# Patient Record
Sex: Male | Born: 1990 | Race: Black or African American | Hispanic: No | Marital: Single | Smoking: Current some day smoker
Health system: Southern US, Community
[De-identification: ages and names within clinical notes are randomized; demographics above are authoritative.]

## PROBLEM LIST (undated history)

## (undated) DIAGNOSIS — R0602 Shortness of breath: Secondary | ICD-10-CM

## (undated) DIAGNOSIS — G971 Other reaction to spinal and lumbar puncture: Secondary | ICD-10-CM

---

## 2009-11-26 ENCOUNTER — Emergency Department (HOSPITAL_COMMUNITY): Admission: EM | Admit: 2009-11-26 | Discharge: 2009-11-26 | Payer: Self-pay | Admitting: Emergency Medicine

## 2010-03-14 LAB — RAPID URINE DRUG SCREEN, HOSP PERFORMED
Amphetamines: NOT DETECTED
Barbiturates: NOT DETECTED
Cocaine: NOT DETECTED
Opiates: NOT DETECTED
Tetrahydrocannabinol: POSITIVE — AB

## 2010-12-21 ENCOUNTER — Inpatient Hospital Stay (HOSPITAL_COMMUNITY)
Admission: EM | Admit: 2010-12-21 | Discharge: 2010-12-28 | DRG: 158 | Disposition: A | Discharge status: Home / Self Care | Payer: Self-pay | Attending: Internal Medicine | Admitting: Internal Medicine

## 2010-12-21 ENCOUNTER — Emergency Department (HOSPITAL_COMMUNITY): Payer: Self-pay

## 2010-12-21 DIAGNOSIS — S025XXA Fracture of tooth (traumatic), initial encounter for closed fracture: Secondary | ICD-10-CM | POA: Diagnosis present

## 2010-12-21 DIAGNOSIS — K029 Dental caries, unspecified: Secondary | ICD-10-CM | POA: Diagnosis present

## 2010-12-21 DIAGNOSIS — F17200 Nicotine dependence, unspecified, uncomplicated: Secondary | ICD-10-CM | POA: Diagnosis present

## 2010-12-21 DIAGNOSIS — R599 Enlarged lymph nodes, unspecified: Secondary | ICD-10-CM | POA: Diagnosis present

## 2010-12-21 DIAGNOSIS — L0201 Cutaneous abscess of face: Secondary | ICD-10-CM | POA: Diagnosis present

## 2010-12-21 DIAGNOSIS — S02609A Fracture of mandible, unspecified, initial encounter for closed fracture: Principal | ICD-10-CM | POA: Diagnosis present

## 2010-12-21 DIAGNOSIS — M7989 Other specified soft tissue disorders: Secondary | ICD-10-CM | POA: Diagnosis present

## 2010-12-21 DIAGNOSIS — S02650A Fracture of angle of mandible, unspecified side, initial encounter for closed fracture: Secondary | ICD-10-CM | POA: Diagnosis present

## 2010-12-21 DIAGNOSIS — K011 Impacted teeth: Secondary | ICD-10-CM | POA: Diagnosis present

## 2010-12-21 DIAGNOSIS — W1809XA Striking against other object with subsequent fall, initial encounter: Secondary | ICD-10-CM | POA: Diagnosis present

## 2010-12-21 DIAGNOSIS — Z8669 Personal history of other diseases of the nervous system and sense organs: Secondary | ICD-10-CM

## 2010-12-21 DIAGNOSIS — Z23 Encounter for immunization: Secondary | ICD-10-CM

## 2010-12-21 DIAGNOSIS — L03211 Cellulitis of face: Secondary | ICD-10-CM | POA: Diagnosis present

## 2010-12-21 HISTORY — DX: Other reaction to spinal and lumbar puncture: G97.1

## 2010-12-21 HISTORY — DX: Shortness of breath: R06.02

## 2010-12-21 LAB — DIFFERENTIAL
Basophils Absolute: 0 10*3/uL (ref 0.0–0.1)
Basophils Relative: 0 % (ref 0–1)
Eosinophils Absolute: 0.1 10*3/uL (ref 0.0–0.7)
Monocytes Absolute: 0.9 10*3/uL (ref 0.1–1.0)
Monocytes Relative: 8 % (ref 3–12)
Neutro Abs: 8.1 10*3/uL — ABNORMAL HIGH (ref 1.7–7.7)

## 2010-12-21 LAB — HEPATIC FUNCTION PANEL
Albumin: 3.2 g/dL — ABNORMAL LOW (ref 3.5–5.2)
Total Bilirubin: 0.7 mg/dL (ref 0.3–1.2)
Total Protein: 7.2 g/dL (ref 6.0–8.3)

## 2010-12-21 LAB — POCT I-STAT, CHEM 8
Calcium, Ion: 1.15 mmol/L (ref 1.12–1.32)
Chloride: 105 mEq/L (ref 96–112)
Glucose, Bld: 75 mg/dL (ref 70–99)
HCT: 43 % (ref 39.0–52.0)
Hemoglobin: 14.6 g/dL (ref 13.0–17.0)
TCO2: 27 mmol/L (ref 0–100)

## 2010-12-21 LAB — CBC
HCT: 40 % (ref 39.0–52.0)
Hemoglobin: 13.5 g/dL (ref 13.0–17.0)
MCH: 28.7 pg (ref 26.0–34.0)
MCHC: 33.8 g/dL (ref 30.0–36.0)
RDW: 13.7 % (ref 11.5–15.5)

## 2010-12-21 LAB — PHOSPHORUS: Phosphorus: 3.4 mg/dL (ref 2.3–4.6)

## 2010-12-21 LAB — MAGNESIUM: Magnesium: 1.9 mg/dL (ref 1.5–2.5)

## 2010-12-21 MED ORDER — VANCOMYCIN HCL IN DEXTROSE 1-5 GM/200ML-% IV SOLN
1000.0000 mg | Freq: Once | INTRAVENOUS | Status: AC
  Administered 2010-12-21: 1000 mg via INTRAVENOUS
  Filled 2010-12-21: qty 200

## 2010-12-21 MED ORDER — SODIUM CHLORIDE 0.9 % IV SOLN
INTRAVENOUS | Status: DC
  Administered 2010-12-21 – 2010-12-26 (×7): via INTRAVENOUS
  Administered 2010-12-27: 1000 mL via INTRAVENOUS

## 2010-12-21 MED ORDER — OXYCODONE HCL 5 MG PO TABS
5.0000 mg | ORAL_TABLET | ORAL | Status: DC | PRN
  Administered 2010-12-21 – 2010-12-28 (×16): 5 mg via ORAL
  Filled 2010-12-21 (×15): qty 1

## 2010-12-21 MED ORDER — MORPHINE SULFATE 4 MG/ML IJ SOLN
4.0000 mg | Freq: Once | INTRAMUSCULAR | Status: AC
  Administered 2010-12-21: 4 mg via INTRAVENOUS
  Filled 2010-12-21: qty 1

## 2010-12-21 MED ORDER — IOHEXOL 300 MG/ML  SOLN
75.0000 mL | Freq: Once | INTRAMUSCULAR | Status: AC | PRN
  Administered 2010-12-21: 75 mL via INTRAVENOUS

## 2010-12-21 MED ORDER — MORPHINE SULFATE 4 MG/ML IJ SOLN: 4.0000 mg | Freq: Once | INTRAMUSCULAR | Status: DC

## 2010-12-21 MED ORDER — MORPHINE SULFATE 2 MG/ML IJ SOLN
INTRAMUSCULAR | Status: AC
  Administered 2010-12-22: 1 mg via INTRAVENOUS
  Filled 2010-12-21: qty 1

## 2010-12-21 MED ORDER — OXYCODONE-ACETAMINOPHEN 5-325 MG PO TABS
1.0000 | ORAL_TABLET | Freq: Once | ORAL | Status: AC
  Administered 2010-12-21: 1 via ORAL
  Filled 2010-12-21: qty 1

## 2010-12-21 MED ORDER — VANCOMYCIN HCL IN DEXTROSE 1-5 GM/200ML-% IV SOLN: 1000.0000 mg | Freq: Two times a day (BID) | INTRAVENOUS | Status: DC

## 2010-12-21 MED ORDER — MORPHINE SULFATE 2 MG/ML IJ SOLN
1.0000 mg | INTRAMUSCULAR | Status: DC | PRN
  Administered 2010-12-22 (×4): 1 mg via INTRAVENOUS
  Filled 2010-12-21 (×4): qty 1

## 2010-12-21 MED ORDER — ONDANSETRON HCL 4 MG/2ML IJ SOLN: 4.0000 mg | Freq: Once | INTRAMUSCULAR | Status: DC

## 2010-12-21 MED ORDER — OXYCODONE HCL 5 MG PO TABS
ORAL_TABLET | ORAL | Status: AC
  Administered 2010-12-21: 5 mg via ORAL
  Filled 2010-12-21: qty 1

## 2010-12-21 MED ORDER — ONDANSETRON HCL 4 MG/2ML IJ SOLN
INTRAMUSCULAR | Status: AC
  Filled 2010-12-21: qty 2

## 2010-12-21 MED ORDER — ONDANSETRON HCL 4 MG PO TABS: 4.0000 mg | ORAL_TABLET | Freq: Four times a day (QID) | ORAL | Status: DC | PRN

## 2010-12-21 MED ORDER — ONDANSETRON HCL 4 MG/2ML IJ SOLN: 4.0000 mg | Freq: Four times a day (QID) | INTRAMUSCULAR | Status: DC | PRN

## 2010-12-21 NOTE — Progress Notes (Signed)
ANTIBIOTIC CONSULT NOTE - INITIAL  Pharmacy Consult for Vancomycin  Indication: facial cellulitis  No Known Allergies  Patient Measurements: Weight: 77.1 kg (from patient) Height: 6' (from patient)  Vital Signs: Temp: 97.3 F (36.3 C) (12/20 1821) Temp src: Oral (12/20 1821) BP: 128/112 mmHg (12/20 1821) Pulse Rate: 77  (12/20 1821)  Labs:  Basename 12/21/10 1241 12/21/10 1215  WBC -- 10.9*  HGB 14.6 13.5  PLT -- 268  LABCREA -- --  CREATININE 1.10 --   CrCl is unknown because there is no height on file for the current visit.  Microbiology: No results found for this or any previous visit (from the past 720 hour(s)).  Medical History: History reviewed. No pertinent past medical history.  Medications:  Ibuprofen 600mg  po q6h as needed for pain  Assessment: Pharmacist System-Based Medication Review: Anticoagulation: none pta Infectious Disease: Severe cellulitis of left face and neck, recent molar fillings but MD does not feel this is the source of the inflammation, WBC 10.9, afebrile, starting Vancomycin IV. 1g given in the ED at 1607.   ENT has seen- no surgery but wants to continue Vancomycin. HIV pending.  Endocrinology: GLU wnl. TSH pending Neurology: UDS pending Gastrointestional/Nutrition: Hepatic panel pending.  Nephrology: SCr 1.10/ estCrCL>100 ml/min.  Best Practices: SCDs only.    Goal of Therapy:  Vancomycin trough level 10-15 mcg/ml  Plan:  1. Vancomycin 500mg  IV now to equal total of 1500mg , then 1500mg  IV q12h.  2. Will follow up renal function and assess levels as appropriate.   Fayne Norrie 12/21/2010,8:16 PM

## 2010-12-21 NOTE — ED Notes (Signed)
[id number] READY

## 2010-12-21 NOTE — ED Notes (Signed)
Patient presents with left lower molar tooth pain with radiation to left jaw and chin with swelling since yesterday.

## 2010-12-21 NOTE — ED Provider Notes (Signed)
History     CSN: [id number]  Arrival date & time 12/21/10  1150   First MD Initiated Contact with Patient 12/21/10 1157      Chief Complaint  Patient presents with  . Dental Pain    Swelling near fillings    (Consider location/radiation/quality/duration/timing/severity/associated sxs/prior treatment) HPI Patient is a 20 year old male with no history of prior medical problems who presents today complaining of left-sided neck swelling, difficulty swallowing, sensation that his throat is closing, and left molar pain. Patient has history of fillings in his posterior-most 3 teeth on the left mandible. He has not seen a dentist. Yesterday patient noted swelling both beneath his tong, under his chin externally, M.D. on the left side of his neck. He has some mild trismus. Patient was feeling fine before yesterday. He denies any fevers, nausea, or vomiting. He does have some hoarseness of his voice. He is having no difficulty with breathing or managing his secretions at this time. The symptoms are mainly notable by report. He does have some appreciable sublingual swelling. Vital signs are stable and patient is nontoxic-appearing on presentation. History reviewed. No pertinent past medical history.  History reviewed. No pertinent past surgical history.  History reviewed. No pertinent family history.  History  Substance Use Topics  . Smoking status: Current Some Day Smoker  . Smokeless tobacco: Not on file  . Alcohol Use: Yes      Review of Systems  Constitutional: Negative.   HENT: Positive for sore throat, facial swelling, trouble swallowing, neck pain, dental problem and voice change. Negative for drooling.   Eyes: Negative.   Cardiovascular: Negative.   Gastrointestinal: Negative.   Genitourinary: Negative.   Skin: Negative.   Neurological: Negative.   Hematological: Negative.   Psychiatric/Behavioral: Negative.   All other systems reviewed and are negative.    Allergies    Review of patient's allergies indicates no known allergies.  Home Medications   Current Outpatient Rx  Name Route Sig Dispense Refill  . IBUPROFEN 200 MG PO TABS Oral Take 600 mg by mouth every 6 (six) hours as needed. As needed for pain.       BP 119/66  Pulse 83  Temp(Src) 98.9 F (37.2 C) (Oral)  Resp 16  SpO2 99%  Physical Exam  Nursing note and vitals reviewed. Constitutional: He is oriented to person, place, and time. He appears well-developed and well-nourished. No distress.  HENT:  Head: Normocephalic and atraumatic.  Nose: Nose normal.  Mouth/Throat:         Sublingual swelling noted particularly on the left. There is some erythema without fluctuance or obvious induration noted. There is swelling along interior of the left cheek without obvious palpable abscess. Patient also has palpable left-sided neck swelling. He has no stridor or difficulty managing his secretions at this time. Patient is able to flex and extend neck without difficulty. He has no difficulty with lateral neck movement either. Patient does have trismus noted as well.  Eyes: Conjunctivae and EOM are normal. Pupils are equal, round, and reactive to light.  Neck: Normal range of motion.       See HEENT  Cardiovascular: Normal rate, regular rhythm, normal heart sounds and intact distal pulses.  Exam reveals no gallop and no friction rub.   No murmur heard. Pulmonary/Chest: Effort normal and breath sounds normal. No respiratory distress. He has no wheezes. He has no rales.  Abdominal: Soft. Bowel sounds are normal. He exhibits no distension. There is no tenderness. There is no  rebound and no guarding.  Musculoskeletal: Normal range of motion.  Neurological: He is alert and oriented to person, place, and time. No cranial nerve deficit. He exhibits normal muscle tone. Coordination normal.  Skin: Skin is warm and dry. No rash noted.  Psychiatric: He has a normal mood and affect.    ED Course  Procedures  (including critical care time)  Labs Reviewed  CBC - Abnormal; Notable for the following:    WBC 10.9 (*)    All other components within normal limits  DIFFERENTIAL - Abnormal; Notable for the following:    Neutro Abs 8.1 (*)    All other components within normal limits  POCT I-STAT, CHEM 8  I-STAT, CHEM 8   No results found.   No diagnosis found.    MDM  Patient presented complaining of left-sided neck swelling, difficulty opening his mouth, difficulty swallowing, and left mandibular posterior dental pain. Patient did have evidence of sublingual swelling as well as neck swelling. He was breathing easily and managing his secretions well. He denies any fevers, nausea, or vomiting. Patient has a history of immunocompromise. I discussed the possibility of managing this conservatively as a possible dental process with oral antibiotics and oral pain medications. Patient complained of having significant difficulty with swallowing and he did have objective swelling noted along the inferior border of the left mandible with some sublingual swelling noted. There he was nontoxic basic blood work including CBC and i-STAT as well as CT of the neck with IV contrast was performed.  Patient had minimal leukocytosis of 10.9. Renal panel was within normal limits. CT scan showed extensive cellulitis of the face and neck. There was a dental abscess identified but this did not appear to be the source of patient's infection. Patient had a phlegmon noted with no drainable fluid collection.  Patient was transferred to CDU at 3 PM 2 cellulitis protocol. Patient received vancomycin IV. While he remained hemodynamically stable and continued to control his secretions as well as his airway the patient complained of persistent pain. I spoke to the ENT on call Dr. Suszanne Conners who was happy to follow the patient in consultation but recommended admission given that the patient's symptoms will likely persist for 2 or 3 days. Patient  was admitted to internal medicine for further management.        Cyndra Numbers, MD 12/21/10 2130

## 2010-12-21 NOTE — ED Notes (Signed)
Called floor to give report and spoke with Fredonia Highland, RN

## 2010-12-21 NOTE — ED Notes (Signed)
PT MADE AWARE HE IS BEING ADMITTED AND IS AGREEABLE. REPORT TO [facility] ON YELLOW.

## 2010-12-21 NOTE — ED Notes (Signed)
PT IS DRINKING LIQUIDS WELL . STATES SOME PAIN WITH SWALLOWING SOLID FOOD BUT NO PROBLEM WITH SOFT FOODS. VOICE IS CLEAR. NO COUGHING. LUNGS CLEAR. SATS 100% ON ROOM AIR

## 2010-12-21 NOTE — H&P (Signed)
PCP:  No primary provider on file.   DOA:  12/21/2010 11:53 AM  Chief Complaint:  Face and Neck Swelling  HPI: Patient is a 20 year old male with no history of prior medical problems who presents to [facility] ED with main concern of left-sided neck swelling, difficulty swallowing, sensation that his throat is closing, and left molar pain. Patient has history of fillings in his posterior-most 3 teeth on the left mandible. He has not seen a dentist. Pt also noticed swelling under his left chin yesterday, one day prior to admission. Pt denies any specific aggravating or alleviating factors, no similar episodes in the past, no known sick contacts or exposures, no fevers, no chills, no other systemic symptoms of weight loss or gain, changes in appetite, no night sweats.  Allergies: No Known Allergies  Prior to Admission medications   Medication Sig Start Date End Date Taking? Authorizing Provider  ibuprofen (ADVIL,MOTRIN) 200 MG tablet Take 600 mg by mouth every 6 (six) hours as needed. As needed for pain.    Yes Historical Provider, MD   History reviewed. No pertinent past medical history.  History reviewed. No pertinent past surgical history.  Social History:  reports that he has been smoking.  He does not have any smokeless tobacco history on file. He reports that he drinks alcohol. He reports that he does not use illicit drugs.  History reviewed. No pertinent family history.  Review of Systems:  Constitutional: Denies fever, chills, diaphoresis, appetite change and fatigue.  HEENT: Denies photophobia, eye pain, redness, hearing loss, ear pain, congestion, sore throat, rhinorrhea, sneezing, mouth sores, neck stiffness and tinnitus.   Respiratory: Denies SOB, DOE, cough, chest tightness,  and wheezing.   Cardiovascular: Denies chest pain, palpitations and leg swelling.  Gastrointestinal: Denies nausea, vomiting, abdominal pain, diarrhea, constipation, blood in stool and abdominal distention.    Genitourinary: Denies dysuria, urgency, frequency, hematuria, flank pain and difficulty urinating.  Musculoskeletal: Denies myalgias, back pain, joint swelling, arthralgias and gait problem.  Skin: per HPI Neurological: Denies dizziness, seizures, syncope, weakness, light-headedness, numbness and headaches.  Hematological: Denies adenopathy. Easy bruising, personal or family bleeding history  Psychiatric/Behavioral: Denies suicidal ideation, mood changes, confusion, nervousness, sleep disturbance and agitation  Physical Exam:  Filed Vitals:   12/21/10 1645 12/21/10 1717 12/21/10 1745 12/21/10 1821  BP:  117/70  128/112  Pulse: 68 80 69 77  Temp:  98 F (36.7 C)  97.3 F (36.3 C)  TempSrc:  Oral  Oral  Resp:  16  18  SpO2: 100%  100%     Constitutional: Vital signs reviewed.  Patient is a well-developed and well-nourished in no acute distress and cooperative with exam. Alert and oriented x3.  Head: Normocephalic and atraumatic Ear: TM normal bilaterally Mouth: no erythema or exudates, MMM Eyes: PERRL, EOMI, conjunctivae normal, No scleral icterus.  Neck: Supple, Trachea midline normal ROM, No JVD, mass, thyromegaly, or carotid bruit present.  Cardiovascular: RRR, S1 normal, S2 normal, no MRG, pulses symmetric and intact bilaterally Pulmonary/Chest: CTAB, no wheezes, rales, or rhonchi Abdominal: Soft. Non-tender, non-distended, bowel sounds are normal, no masses, organomegaly, or guarding present.  GU: no CVA tenderness Musculoskeletal: No joint deformities, erythema, or stiffness, ROM full and no nontender Ext: no edema and no cyanosis, pulses palpable bilaterally (DP and PT) Hematology: no cervical, inginal, or axillary adenopathy.  Neurological: A&O x3, Strenght is normal and symmetric bilaterally, cranial nerve II-XII are grossly intact, no focal motor deficit, sensory intact to light touch bilaterally.  Skin:  Left facial erythema with tenderness to palpation extending to the  left neck area Psychiatric: Normal mood and affect. speech and behavior is normal. Judgment and thought content normal. Cognition and memory are normal.   Labs on Admission:  Results for orders placed during the hospital encounter of 12/21/10 (from the past 48 hour(s))  CBC     Status: Abnormal   Collection Time   12/21/10 12:15 PM      Component Value Range Comment   WBC 10.9 (*) 4.0 - 10.5 (K/uL)    RBC 4.71  4.22 - 5.81 (MIL/uL)    Hemoglobin 13.5  13.0 - 17.0 (g/dL)    HCT 16.1  09.6 - 04.5 (%)    MCV 84.9  78.0 - 100.0 (fL)    MCH 28.7  26.0 - 34.0 (pg)    MCHC 33.8  30.0 - 36.0 (g/dL)    RDW 40.9  81.1 - 91.4 (%)    Platelets 268  150 - 400 (K/uL)   DIFFERENTIAL     Status: Abnormal   Collection Time   12/21/10 12:15 PM      Component Value Range Comment   Neutrophils Relative 75  43 - 77 (%)    Neutro Abs 8.1 (*) 1.7 - 7.7 (K/uL)    Lymphocytes Relative 17  12 - 46 (%)    Lymphs Abs 1.8  0.7 - 4.0 (K/uL)    Monocytes Relative 8  3 - 12 (%)    Monocytes Absolute 0.9  0.1 - 1.0 (K/uL)    Eosinophils Relative 1  0 - 5 (%)    Eosinophils Absolute 0.1  0.0 - 0.7 (K/uL)    Basophils Relative 0  0 - 1 (%)    Basophils Absolute 0.0  0.0 - 0.1 (K/uL)   POCT I-STAT, CHEM 8     Status: Normal   Collection Time   12/21/10 12:41 PM      Component Value Range Comment   Sodium 140  135 - 145 (mEq/L)    Potassium 3.7  3.5 - 5.1 (mEq/L)    Chloride 105  96 - 112 (mEq/L)    BUN 7  6 - 23 (mg/dL)    Creatinine, Ser 7.82  0.50 - 1.35 (mg/dL)    Glucose, Bld 75  70 - 99 (mg/dL)    Calcium, Ion 9.56  1.12 - 1.32 (mmol/L)    TCO2 27  0 - 100 (mmol/L)    Hemoglobin 14.6  13.0 - 17.0 (g/dL)    HCT 21.3  08.6 - 57.8 (%)     Radiological Exams on Admission:  Ct Soft Tissue Neck W Contrast 12/21/2010   IMPRESSION:  1.  Severe cellulitis of the left face and neck.  Significant submental involvement with phlegmon, but no drainable or organized fluid collection or sublingual space  extension. If the patient does not improve on the appropriate treatment as expected, recommend repeat imaging with attention to this area.  2.  Reactive lymphadenopathy; left level II and bilateral level I.  3.  Large dental caries left maxillary posterior molar for which dental follow up is recommended, but this does not seem to be the epicenter of the inflammatory process.   Assessment/Plan  Principal Problem:  *Cellulitis, face - Left face and neck - unclear etiology of the cellulitis at this time and per CT does not seem to be epicenter of an inflammatory process, I do think that molar caries could be potentially contributing - will obtain HIV screening test  for now and follow up on results - will start vancomycin and monitor the clinical progress - ENT consulted Dr. Suszanne Conners and he has reviewed the images and did not think this necessitates surgical intervention at this time - he agrees with continuing Vancomycin - Dr. Danice Goltz will see pt in AM in consultation - provide supportive care with IVF, analgesia for adequate pain control, anti - emetics, anti - pyretics if needed - if pt does not respond to above measures, would consider consulting Dr. Kristin Bruins who sees patients in hospital since pt is not going to be able to afford dentist in an outpatient0   setting   Disposition - plan of care and diagnosis, diagnostic studies and test results were discussed with pt  - pt verbalized understanding  Time Spent on Admission: Over 30 minutes  MAGICK-Lindie Roberson 12/21/2010, 8:11 PM  [facility] Pager [phone]

## 2010-12-21 NOTE — ED Notes (Signed)
Pt c/o facial pain at 8/10, denies difficulty swallowing.  Reports mild nausea without emesis.

## 2010-12-22 ENCOUNTER — Inpatient Hospital Stay (HOSPITAL_COMMUNITY): Payer: Self-pay

## 2010-12-22 ENCOUNTER — Encounter (HOSPITAL_COMMUNITY): Payer: Self-pay | Admitting: *Deleted

## 2010-12-22 DIAGNOSIS — S02650A Fracture of angle of mandible, unspecified side, initial encounter for closed fracture: Secondary | ICD-10-CM | POA: Diagnosis present

## 2010-12-22 LAB — RAPID URINE DRUG SCREEN, HOSP PERFORMED
Amphetamines: NOT DETECTED
Barbiturates: NOT DETECTED
Benzodiazepines: NOT DETECTED
Cocaine: NOT DETECTED
Tetrahydrocannabinol: POSITIVE — AB

## 2010-12-22 LAB — BASIC METABOLIC PANEL
BUN: 3 mg/dL — ABNORMAL LOW (ref 6–23)
CO2: 25 mEq/L (ref 19–32)
Calcium: 9 mg/dL (ref 8.4–10.5)
Chloride: 100 mEq/L (ref 96–112)
Creatinine, Ser: 0.78 mg/dL (ref 0.50–1.35)
GFR calc Af Amer: >90 mL/min (ref >90)
GFR calc non Af Amer: >90 mL/min (ref >90)
Glucose, Bld: 99 mg/dL (ref 70–99)
Potassium: 3.3 mEq/L — ABNORMAL LOW (ref 3.5–5.1)
Sodium: 133 mEq/L — ABNORMAL LOW (ref 135–145)

## 2010-12-22 LAB — TSH: TSH: 2.037 u[IU]/mL (ref 0.350–4.500)

## 2010-12-22 LAB — CBC
Hemoglobin: 12.2 g/dL — ABNORMAL LOW (ref 13.0–17.0)
MCHC: 34.6 g/dL (ref 30.0–36.0)
WBC: 13.5 10*3/uL — ABNORMAL HIGH (ref 4.0–10.5)

## 2010-12-22 LAB — GLUCOSE, CAPILLARY: Glucose-Capillary: 112 mg/dL — ABNORMAL HIGH (ref 70–99)

## 2010-12-22 LAB — HIV ANTIBODY (ROUTINE TESTING W REFLEX): HIV: NONREACTIVE

## 2010-12-22 MED ORDER — ZOLPIDEM TARTRATE 5 MG PO TABS
5.0000 mg | ORAL_TABLET | Freq: Every day | ORAL | Status: DC
  Administered 2010-12-22 – 2010-12-27 (×7): 5 mg via ORAL
  Filled 2010-12-22 (×7): qty 1

## 2010-12-22 MED ORDER — VANCOMYCIN HCL 1000 MG IV SOLR
1500.0000 mg | Freq: Two times a day (BID) | INTRAVENOUS | Status: DC
  Administered 2010-12-22 – 2010-12-25 (×7): 1500 mg via INTRAVENOUS
  Filled 2010-12-22 (×9): qty 1500

## 2010-12-22 MED ORDER — VANCOMYCIN HCL 500 MG IV SOLR
500.0000 mg | Freq: Once | INTRAVENOUS | Status: AC
  Administered 2010-12-22: 500 mg via INTRAVENOUS
  Filled 2010-12-22: qty 500

## 2010-12-22 MED ORDER — MORPHINE SULFATE 2 MG/ML IJ SOLN
2.0000 mg | INTRAMUSCULAR | Status: DC | PRN
  Administered 2010-12-22 – 2010-12-28 (×22): 2 mg via INTRAVENOUS
  Filled 2010-12-22 (×22): qty 1

## 2010-12-22 MED ORDER — SODIUM CHLORIDE 0.9 % IV SOLN
1.5000 g | Freq: Four times a day (QID) | INTRAVENOUS | Status: DC
  Administered 2010-12-22 – 2010-12-24 (×7): 1.5 g via INTRAVENOUS
  Filled 2010-12-22 (×12): qty 1.5

## 2010-12-22 MED ORDER — ACETAMINOPHEN 325 MG PO TABS
650.0000 mg | ORAL_TABLET | Freq: Four times a day (QID) | ORAL | Status: DC | PRN
  Administered 2010-12-22 – 2010-12-26 (×4): 650 mg via ORAL
  Filled 2010-12-22 (×4): qty 2

## 2010-12-22 MED ORDER — SODIUM CHLORIDE 0.9 % IV SOLN
1.5000 g | INTRAVENOUS | Status: AC
  Administered 2010-12-22: 1.5 g via INTRAVENOUS
  Filled 2010-12-22: qty 1.5

## 2010-12-22 NOTE — Progress Notes (Signed)
PATIENT DETAILS Name: James Webb Age: 20 y.o. Sex: male Date of Birth: Feb 03, 1990 Admit Date: 12/21/2010 PCP:No primary provider on file.  Subjective: Still having left facial pain and left submental pain.Fever this morning as well.  Objective: Vital signs in last 24 hours: Filed Vitals:   12/21/10 2142 12/21/10 2205 12/22/10 0001 12/22/10 0500  BP: 134/86 141/99  124/73  Pulse: 78 63 83 82  Temp:  99.5 F (37.5 C)  101.3 F (38.5 C)  TempSrc:  Oral  Oral  Resp:  17 18 18   Height:      Weight:  73.3 kg (161 lb 9.6 oz)    SpO2: 100% 98% 99% 96%    Weight change:   Body mass index is 21.92 kg/(m^2).  Intake/Output from previous day:  Intake/Output Summary (Last 24 hours) at 12/22/10 1241 Last data filed at 12/22/10 0900  Gross per 24 hour  Intake    240 ml  Output    675 ml  Net   -435 ml    PHYSICAL EXAM: Gen Exam: Awake and alert with clear speech.   Neck: Supple, No JVD.   HEENT-painfull swelling in the sub-mental area and left submandibular area. Chest: B/L Clear.   CVS: S1 S2 Regular, no murmurs.  Abdomen: soft, BS +, non tender, non distended.  Extremities: no edema, lower extremities warm to touch. Neurologic: Non Focal.   Skin: No Rash.   Wounds: N/A.    CONSULTS:  ENT  LAB RESULTS: CBC  Lab 12/22/10 0630 12/21/10 1241 12/21/10 1215  WBC 13.5* -- 10.9*  HGB 12.2* 14.6 13.5  HCT 35.3* 43.0 40.0  PLT 258 -- 268  MCV 84.0 -- 84.9  MCH 29.0 -- 28.7  MCHC 34.6 -- 33.8  RDW 13.5 -- 13.7  LYMPHSABS -- -- 1.8  MONOABS -- -- 0.9  EOSABS -- -- 0.1  BASOSABS -- -- 0.0  BANDABS -- -- --    Chemistries   Lab 12/22/10 0630 12/21/10 2018 12/21/10 1241  NA 133* -- 140  K 3.3* -- 3.7  CL 100 -- 105  CO2 25 -- --  GLUCOSE 99 -- 75  BUN 3* -- 7  CREATININE 0.78 -- 1.10  CALCIUM 9.0 -- --  MG -- 1.9 --    GFR Estimated Creatinine Clearance: 150.2 ml/min (by C-G formula based on Cr of 0.78).  Coagulation profile No results found  for this basename: INR:5,PROTIME:5 in the last 168 hours  Cardiac Enzymes No results found for this basename: CK:3,CKMB:3,TROPONINI:3,MYOGLOBIN:3 in the last 168 hours  No components found with this basename: POCBNP:3 No results found for this basename: DDIMER:2 in the last 72 hours No results found for this basename: HGBA1C:2 in the last 72 hours No results found for this basename: CHOL:2,HDL:2,LDLCALC:2,TRIG:2,CHOLHDL:2,LDLDIRECT:2 in the last 72 hours  Basename 12/21/10 2018  TSH 2.037  T4TOTAL --  T3FREE --  THYROIDAB --   No results found for this basename: VITAMINB12:2,FOLATE:2,FERRITIN:2,TIBC:2,IRON:2,RETICCTPCT:2 in the last 72 hours No results found for this basename: LIPASE:2,AMYLASE:2 in the last 72 hours  Urine Studies No results found for this basename: UACOL:2,UAPR:2,USPG:2,UPH:2,UTP:2,UGL:2,UKET:2,UBIL:2,UHGB:2,UNIT:2,UROB:2,ULEU:2,UEPI:2,UWBC:2,URBC:2,UBAC:2,CAST:2,CRYS:2,UCOM:2,BILUA:2 in the last 72 hours  MICROBIOLOGY: No results found for this or any previous visit (from the past 240 hour(s)).  RADIOLOGY STUDIES/RESULTS: Dg Orthopantogram  12/22/2010  *RADIOLOGY REPORT*  Clinical Data: Left jaw pain.  Larey Seat out of bed and hit face on table.  DG ORTHOPANTOGRAM/PANORAMIC  Comparison: CT neck 12/21/2010  Findings: Impacted left maxillary wisdom tooth (17) with an acute fracture extending from  the roots to the angle of the mandible. There is a lucency surrounding the tooth suggesting significant periapical disease. Periapical disease surrounding this tooth was present on prior CT.  Multiple missing teeth on the right  mandible, suspected absent 29, 30, and 32.  Impacted right maxillary wisdom tooth (1).  Significant dental caries tooth 15.  IMPRESSION: Impacted  left maxillary wisdom tooth (17) with significant periodontal disease, now with superimposed acute unilateral left mandibular fracture coursing obliquely from the roots towards the angle of the mandible.  See  comments above.  Original Report Authenticated By: Elsie Stain, M.D.   Ct Soft Tissue Neck W Contrast  12/21/2010  *RADIOLOGY REPORT*  Clinical Data: 20 year old male with left-sided neck pain radiating to the jaw.  Suspect deep space neck infection.  CT NECK WITH CONTRAST  Technique:  Multidetector CT imaging of the neck was performed with intravenous contrast.  Contrast: 75mL OMNIPAQUE IOHEXOL 300 MG/ML IV SOLN  Comparison: None.  Findings: Lung apices are clear.  Negative visualized superior mediastinum.  Paucity of subcutaneous fat.  Hyperenhancing level I lymph nodes are mildly enlarged up to 9 mm short axis.  There is widespread indistinct fat stranding along the left and anterior neck, including the submental space.  Hyperenhancing level II nodes measure up to 17 mm in short axis.   Stranding surrounds both submandibular glands, greater on the left, but no definite sialoadenitis is noted.  The left platysma is asymmetrically thickened.  The parotid glands appear within normal limits.  The parapharyngeal and sublingual spaces are within normal limits. There may be a trace retropharyngeal effusion, but there is no drainable or organized fluid collection in the neck.  The adenoid and tonsillar pillars are hypertrophied.  The larynx and thyroid are normal.  Visualized major vascular structures are patent including both internal jugular veins.  Negative visualized brain parenchyma and orbits. Visualized paranasal sinuses and mastoids are clear.  Large dental caries of the posterior left maxillary molar just anterior to the wisdom tooth.  Still, this does not seem to be the epicenter of the previously described inflammation.  No other acute osseous abnormality.  IMPRESSION: 1.  Severe cellulitis of the left face and neck.  Significant submental involvement with phlegmon, but no drainable or organized fluid collection or sublingual space extension. If the patient does not improve on the appropriate treatment  as expected, recommend repeat imaging with attention to this area. 2.  Reactive lymphadenopathy; left level II and bilateral level I. 3.  Large dental caries left maxillary posterior molar for which dental follow up is recommended, but this does not seem to be the epicenter of the inflammatory process.  Original Report Authenticated By: Harley Hallmark, M.D.    MEDICATIONS: Scheduled Meds:   . ampicillin-sulbactam (UNASYN) IV  1.5 g Intravenous NOW  . ampicillin-sulbactam (UNASYN) IV  1.5 g Intravenous Q6H  .  morphine injection  4 mg Intravenous Once  . oxyCODONE-acetaminophen  1 tablet Oral Once  . vancomycin  1,500 mg Intravenous Q12H  . vancomycin  500 mg Intravenous Once  . vancomycin  1,000 mg Intravenous Once  . zolpidem  5 mg Oral QHS  . DISCONTD:  morphine injection  4 mg Intravenous Once  . DISCONTD: ondansetron  4 mg Intravenous Once  . DISCONTD: vancomycin  1,000 mg Intravenous Q12H   Continuous Infusions:   . sodium chloride 75 mL/hr at 12/21/10 2147   PRN Meds:.acetaminophen, iohexol, morphine, ondansetron (ZOFRAN) IV, ondansetron, oxyCODONE  Antibiotics: Anti-infectives  Start     Dose/Rate Route Frequency Ordered Stop   12/22/10 1600   ampicillin-sulbactam (UNASYN) 1.5 g in sodium chloride 0.9 % 50 mL IVPB        1.5 g 100 mL/hr over 30 Minutes Intravenous Every 6 hours 12/22/10 0856     12/22/10 0900   ampicillin-sulbactam (UNASYN) 1.5 g in sodium chloride 0.9 % 50 mL IVPB        1.5 g 100 mL/hr over 30 Minutes Intravenous NOW 12/22/10 0856 12/22/10 1224   12/22/10 0800   vancomycin (VANCOCIN) 1,500 mg in sodium chloride 0.9 % 500 mL IVPB        1,500 mg 250 mL/hr over 120 Minutes Intravenous Every 12 hours 12/22/10 0523     12/22/10 0530   vancomycin (VANCOCIN) 500 mg in sodium chloride 0.9 % 100 mL IVPB        500 mg 100 mL/hr over 60 Minutes Intravenous  Once 12/22/10 0523 12/22/10 0720   12/21/10 2015   vancomycin (VANCOCIN) IVPB 1000 mg/200 mL premix   Status:  Discontinued        1,000 mg 200 mL/hr over 60 Minutes Intravenous Every 12 hours 12/21/10 2010 12/21/10 2028   12/21/10 1445   vancomycin (VANCOCIN) IVPB 1000 mg/200 mL premix        1,000 mg 200 mL/hr over 60 Minutes Intravenous  Once 12/21/10 1437 12/21/10 1607          Assessment/Plan: Patient Active Hospital Problem List: Cellulitis, Submental and Left Submandibular area   Assessment: Febrile, essentially unchanged.No stridor.   Plan: Add Unasyn, continue with Vancomycin, spoke with Dr Ina Homes, he will evaluate later, if symptoms not getting better in 48 hours-he suggests to repeat CT scan. Will continue to monitor closely.  Disposition: Remain Inpatient  Code Status: Full code  Maretta Bees,   MD. 12/22/2010, 12:41 PM

## 2010-12-22 NOTE — Consult Note (Signed)
Reason for Consult: Neck cellulitis Referring Physician: Maretta Bees, MD.  HPI:  James Webb is an 20 y.o. male who presents to the [facility] emergency room yesterday, complaining of left sided neck swelling and pain for 2 days. According to the patient, he accidentally fell and hit his left face on a nightstand the night before. He complained of difficulty swallowing, sensation that his throat is closing, mild hoarseness and left molar pain. He had no difficulty with breathing or managing his secretions. CT scan done in the ER showed extensive cellulitis of the face and neck. There was a dental abscess identified but this did not appear to be the source of patient's infection. Patient had a phlegmon noted with no drainable fluid collection.  A subsequent panorex film showed a non displaced fracture of the left angle of the mandible, which is likely the cause of his infection.   Past Medical History  Diagnosis Date  . 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 Shortness of breath     SOB x 3 days    History reviewed. No pertinent past surgical history.  History reviewed. No pertinent family history.  Social History:  reports that he has been smoking Cigarettes.  He has a 2.5 pack-year smoking history. He has never used smokeless tobacco. He reports that he drinks about 5.4 ounces of alcohol per week. He reports that he uses illicit drugs (Marijuana).  Allergies: No Known Allergies  Medications:  I have reviewed the patient's current medications. Prior to Admission:  Prescriptions prior to admission  Medication Sig Dispense Refill  . ibuprofen (ADVIL,MOTRIN) 200 MG tablet Take 600 mg by mouth every 6 (six) hours as needed. As needed for pain.        Scheduled:   . ampicillin-sulbactam (UNASYN) IV  1.5 g Intravenous NOW  . ampicillin-sulbactam (UNASYN) IV  1.5 g Intravenous Q6H  . vancomycin  1,500 mg Intravenous Q12H  . vancomycin  500 mg Intravenous Once    . zolpidem  5 mg Oral QHS  . DISCONTD:  morphine injection  4 mg Intravenous Once  . DISCONTD: ondansetron  4 mg Intravenous Once  . DISCONTD: vancomycin  1,000 mg Intravenous Q12H   Continuous:   . sodium chloride 75 mL/hr at 12/21/10 2147    Results for orders placed during the hospital encounter of 12/21/10 (from the past 48 hour(s))  CBC     Status: Abnormal   Collection Time   12/21/10 12:15 PM      Component Value Range Comment   WBC 10.9 (*) 4.0 - 10.5 (K/uL)    RBC 4.71  4.22 - 5.81 (MIL/uL)    Hemoglobin 13.5  13.0 - 17.0 (g/dL)    HCT 16.1  09.6 - 04.5 (%)    MCV 84.9  78.0 - 100.0 (fL)    MCH 28.7  26.0 - 34.0 (pg)    MCHC 33.8  30.0 - 36.0 (g/dL)    RDW 40.9  81.1 - 91.4 (%)    Platelets 268  150 - 400 (K/uL)   DIFFERENTIAL     Status: Abnormal   Collection Time   12/21/10 12:15 PM      Component Value Range Comment   Neutrophils Relative 75  43 - 77 (%)    Neutro Abs 8.1 (*) 1.7 - 7.7 (K/uL)    Lymphocytes Relative 17  12 - 46 (%)    Lymphs Abs 1.8  0.7 - 4.0 (K/uL)  Monocytes Relative 8  3 - 12 (%)    Monocytes Absolute 0.9  0.1 - 1.0 (K/uL)    Eosinophils Relative 1  0 - 5 (%)    Eosinophils Absolute 0.1  0.0 - 0.7 (K/uL)    Basophils Relative 0  0 - 1 (%)    Basophils Absolute 0.0  0.0 - 0.1 (K/uL)   POCT I-STAT, CHEM 8     Status: Normal   Collection Time   12/21/10 12:41 PM      Component Value Range Comment   Sodium 140  135 - 145 (mEq/L)    Potassium 3.7  3.5 - 5.1 (mEq/L)    Chloride 105  96 - 112 (mEq/L)    BUN 7  6 - 23 (mg/dL)    Creatinine, Ser 1.61  0.50 - 1.35 (mg/dL)    Glucose, Bld 75  70 - 99 (mg/dL)    Calcium, Ion 0.96  1.12 - 1.32 (mmol/L)    TCO2 27  0 - 100 (mmol/L)    Hemoglobin 14.6  13.0 - 17.0 (g/dL)    HCT 04.5  40.9 - 81.1 (%)   HEPATIC FUNCTION PANEL     Status: Abnormal   Collection Time   12/21/10  8:18 PM      Component Value Range Comment   Total Protein 7.2  6.0 - 8.3 (g/dL)    Albumin 3.2 (*) 3.5 - 5.2 (g/dL)     AST 18  0 - 37 (U/L)    ALT 12  0 - 53 (U/L)    Alkaline Phosphatase 49  39 - 117 (U/L)    Total Bilirubin 0.7  0.3 - 1.2 (mg/dL)    Bilirubin, Direct 0.2  0.0 - 0.3 (mg/dL)    Indirect Bilirubin 0.5  0.3 - 0.9 (mg/dL)   MAGNESIUM     Status: Normal   Collection Time   12/21/10  8:18 PM      Component Value Range Comment   Magnesium 1.9  1.5 - 2.5 (mg/dL)   PHOSPHORUS     Status: Normal   Collection Time   12/21/10  8:18 PM      Component Value Range Comment   Phosphorus 3.4  2.3 - 4.6 (mg/dL)   TSH     Status: Normal   Collection Time   12/21/10  8:18 PM      Component Value Range Comment   TSH 2.037  0.350 - 4.500 (uIU/mL)   HIV ANTIBODY (ROUTINE TESTING)     Status: Normal   Collection Time   12/21/10  8:18 PM      Component Value Range Comment   HIV NON REACTIVE  NON REACTIVE    URINE RAPID DRUG SCREEN (HOSP PERFORMED)     Status: Abnormal   Collection Time   12/22/10 12:01 AM      Component Value Range Comment   Opiates POSITIVE (*) NONE DETECTED     Cocaine NONE DETECTED  NONE DETECTED     Benzodiazepines NONE DETECTED  NONE DETECTED     Amphetamines NONE DETECTED  NONE DETECTED     Tetrahydrocannabinol POSITIVE (*) NONE DETECTED     Barbiturates NONE DETECTED  NONE DETECTED    BASIC METABOLIC PANEL     Status: Abnormal   Collection Time   12/22/10  6:30 AM      Component Value Range Comment   Sodium 133 (*) 135 - 145 (mEq/L)    Potassium 3.3 (*) 3.5 - 5.1 (mEq/L)  Chloride 100  96 - 112 (mEq/L)    CO2 25  19 - 32 (mEq/L)    Glucose, Bld 99  70 - 99 (mg/dL)    BUN 3 (*) 6 - 23 (mg/dL)    Creatinine, Ser 1.61  0.50 - 1.35 (mg/dL)    Calcium 9.0  8.4 - 10.5 (mg/dL)    GFR calc non Af Amer >90  >90 (mL/min)    GFR calc Af Amer >90  >90 (mL/min)   CBC     Status: Abnormal   Collection Time   12/22/10  6:30 AM      Component Value Range Comment   WBC 13.5 (*) 4.0 - 10.5 (K/uL)    RBC 4.20 (*) 4.22 - 5.81 (MIL/uL)    Hemoglobin 12.2 (*) 13.0 - 17.0 (g/dL)     HCT 09.6 (*) 04.5 - 52.0 (%)    MCV 84.0  78.0 - 100.0 (fL)    MCH 29.0  26.0 - 34.0 (pg)    MCHC 34.6  30.0 - 36.0 (g/dL)    RDW 40.9  81.1 - 91.4 (%)    Platelets 258  150 - 400 (K/uL)   GLUCOSE, CAPILLARY     Status: Abnormal   Collection Time   12/22/10  8:46 AM      Component Value Range Comment   Glucose-Capillary 112 (*) 70 - 99 (mg/dL)     Dg Orthopantogram  12/22/2010  *RADIOLOGY REPORT*  Clinical Data: Left jaw pain.  Larey Seat out of bed and hit face on table.  DG ORTHOPANTOGRAM/PANORAMIC  Comparison: CT neck 12/21/2010  Findings: Impacted left maxillary wisdom tooth (17) with an acute fracture extending from the roots to the angle of the mandible. There is a lucency surrounding the tooth suggesting significant periapical disease. Periapical disease surrounding this tooth was present on prior CT.  Multiple missing teeth on the right  mandible, suspected absent 29, 30, and 32.  Impacted right maxillary wisdom tooth (1).  Significant dental caries tooth 15.  IMPRESSION: Impacted  left maxillary wisdom tooth (17) with significant periodontal disease, now with superimposed acute unilateral left mandibular fracture coursing obliquely from the roots towards the angle of the mandible.  See comments above.  Original Report Authenticated By: Elsie Stain, M.D.   Ct Soft Tissue Neck W Contrast  12/21/2010  *RADIOLOGY REPORT*  Clinical Data: 20 year old male with left-sided neck pain radiating to the jaw.  Suspect deep space neck infection.  CT NECK WITH CONTRAST  Technique:  Multidetector CT imaging of the neck was performed with intravenous contrast.  Contrast: 75mL OMNIPAQUE IOHEXOL 300 MG/ML IV SOLN  Comparison: None.  Findings: Lung apices are clear.  Negative visualized superior mediastinum.  Paucity of subcutaneous fat.  Hyperenhancing level I lymph nodes are mildly enlarged up to 9 mm short axis.  There is widespread indistinct fat stranding along the left and anterior neck, including the  submental space.  Hyperenhancing level II nodes measure up to 17 mm in short axis.   Stranding surrounds both submandibular glands, greater on the left, but no definite sialoadenitis is noted.  The left platysma is asymmetrically thickened.  The parotid glands appear within normal limits.  The parapharyngeal and sublingual spaces are within normal limits. There may be a trace retropharyngeal effusion, but there is no drainable or organized fluid collection in the neck.  The adenoid and tonsillar pillars are hypertrophied.  The larynx and thyroid are normal.  Visualized major vascular structures are patent including both internal  jugular veins.  Negative visualized brain parenchyma and orbits. Visualized paranasal sinuses and mastoids are clear.  Large dental caries of the posterior left maxillary molar just anterior to the wisdom tooth.  Still, this does not seem to be the epicenter of the previously described inflammation.  No other acute osseous abnormality.  IMPRESSION: 1.  Severe cellulitis of the left face and neck.  Significant submental involvement with phlegmon, but no drainable or organized fluid collection or sublingual space extension. If the patient does not improve on the appropriate treatment as expected, recommend repeat imaging with attention to this area. 2.  Reactive lymphadenopathy; left level II and bilateral level I. 3.  Large dental caries left maxillary posterior molar for which dental follow up is recommended, but this does not seem to be the epicenter of the inflammatory process.  Original Report Authenticated By: Harley Hallmark, M.D.   Review of Systems  Constitutional: Negative.  HENT: Positive for sore throat, facial swelling, trouble swallowing, neck pain, dental problem and voice change. Negative for drooling.  Eyes: Negative.  Cardiovascular: Negative.  Gastrointestinal: Negative.  Genitourinary: Negative.  Skin: Negative.  Neurological: Negative.  Hematological: Negative.    Psychiatric/Behavioral: Negative.  All other systems reviewed and are negative.  Blood pressure 124/75, pulse 87, temperature 98 F (36.7 C), temperature source Axillary, resp. rate 18, height 6' (1.829 m), weight 73.3 kg (161 lb 9.6 oz), SpO2 96.00%.  Physical Exam   Constitutional: He is oriented to person, place, and time. He appears well-developed and well-nourished. No distress.  HENT:  Head: Normocephalic and atraumatic.  Nose: Nose normal.  Mouth/Throat:  Sublingual swelling noted particularly on the left. There is some erythema without fluctuance noted. There is swelling along interior of the left cheek without obvious palpable abscess. Patient also has palpable left-sided neck swelling. Left angle of the mandible is tender to touch. He has no stridor or difficulty managing his secretions at this time. Patient is able to flex and extend neck without difficulty. He has no difficulty with lateral neck movement either. Patient does have mild trismus. Eyes: Conjunctivae and EOM are normal. Pupils are equal, round, and reactive to light.  Neck: Normal range of motion.  Abdominal: Soft. Bowel sounds are normal. He exhibits no distension. There is no tenderness. There is no rebound and no guarding.  Musculoskeletal: Normal range of motion.  Neurological: He is alert and oriented to person, place, and time. No cranial nerve deficit. He exhibits normal muscle tone. Coordination normal.  Skin: Skin is warm and dry. No rash noted.  Psychiatric: He has a normal mood and affect.   Assessment/Plan: Left neck cellulitis secondary to an acute nondisplaced left mandibular fracture.  Continue with IV antibiotics.  Consider switching to Clindamycin to cover his oral anaerobes.  Pt should be on liquid diet for 6 weeks. If he does not show significant clinical improvement in the next 24-36 hours, will get a repeat CT scan to check for drainable abscess.  Will follow.  Brigido Mera,SUI W 12/22/2010, 6:07 PM

## 2010-12-22 NOTE — Progress Notes (Signed)
ANTIBIOTIC CONSULT NOTE - INITIAL  Pharmacy Consult for Unasyn Indication: facial cellulitis  No Known Allergies  Patient Measurements: Weight: 77.1 kg (from patient) Height: 6' (from patient)  Vital Signs: Temp: 101.3 F (38.5 C) (12/21 0500) Temp src: Oral (12/21 0500) BP: 124/73 mmHg (12/21 0500) Pulse Rate: 82  (12/21 0500)  Labs:  Basename 12/22/10 0630 12/21/10 1241 12/21/10 1215  WBC 13.5* -- 10.9*  HGB 12.2* 14.6 13.5  PLT 258 -- 268  LABCREA -- -- --  CREATININE 0.78 1.10 --   Estimated Creatinine Clearance: 150.2 ml/min (by C-G formula based on Cr of 0.78).  Microbiology: No results found for this or any previous visit (from the past 720 hour(s)).  Medical History: Past Medical History  Diagnosis Date  . 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 Shortness of breath     SOB x 3 days    Admit Complaint: Facial cellulitis Pharmacist System-Based Medication Review: Anticoagulation: none pta ID: Severe cellulitis of left face and neck, recent molar fillings but MD does not feel this is the source of the inflammation, Tmax 101.3, WBC up 13.5, ENT has seen- no surgery but wants to continue Vancomycin. Unasyn to start to broaden coverage.    CARDS: VSS ENDO:: GLU wnl. TSH WNL NEURO: +opiate, tetrahydrocannabinol GI/NUTRITION: regular diet, LFTs WNL RENAL: SCr 0.78/CrCL>100 ml/min, K+ 3.3, mild hyponatremia    Plan:  1.  Unasyn 1.5gm IV Q6H 2.  Continue vancomycin 1500mg  Q12H. 3.  Follow-up K+ supplementation 4.  Monitor renal fxn, temperature curve, WBC trend, vanc trough if indicated.   Phillips Climes Dien 12/22/2010,8:50 AM

## 2010-12-22 NOTE — Progress Notes (Signed)
   CARE MANAGEMENT NOTE 12/22/2010  Patient:  James Webb,James Webb   Account Number:  [id number]  Date Initiated:  12/22/2010  Documentation initiated by:  Letha Cape  Subjective/Objective Assessment:   dx cellulitis of face  admit- lives with family     Action/Plan:   ENT to see   Anticipated DC Date:  12/25/2010   Anticipated DC Plan:  HOME/SELF CARE      DC Planning Services  CM consult      Choice offered to / List presented to:             Status of service:  In process, will continue to follow Medicare Important Message given?   (If response is "NO", the following Medicare IM given date fields will be blank) Date Medicare IM given:   Date Additional Medicare IM given:    Discharge Disposition:    Per UR Regulation:    Comments:  PCP [facility] eligibility appt 1/23 at 3 pm, hos f/u apt 2/20 at 2:30 with Dr. Andrey Campanile  12/22/10 15:04 Letha Cape RN, BSN [phone] patient lives with family, pta independent, NCM will continue to follow for dc needs. Patient is eiligible for med ast if needed.

## 2010-12-22 NOTE — Progress Notes (Signed)
   CARE MANAGEMENT NOTE 12/22/2010  Patient:  James Webb,James Webb   Account Number:  [id number]  Date Initiated:  12/22/2010  Documentation initiated by:  Letha Cape  Subjective/Objective Assessment:   dx cellulitis of face  admit- lives with family     Action/Plan:   ENT to see   Anticipated DC Date:  12/25/2010   Anticipated DC Plan:  HOME/SELF CARE      DC Planning Services  CM consult      Choice offered to / List presented to:             Status of service:  In process, will continue to follow Medicare Important Message given?   (If response is "NO", the following Medicare IM given date fields will be blank) Date Medicare IM given:   Date Additional Medicare IM given:    Discharge Disposition:    Per UR Regulation:    Comments:  12/22/10 15:04 Letha Cape RN, BSN [phone] patient lives with family, pta independent, NCM will continue to follow for dc needs.

## 2010-12-22 NOTE — Progress Notes (Signed)
Subjective: Pt reports no change in his left neck pain. It hurts to open his mouth.  Objective: Vital signs in last 24 hours: Temp:  [98 F (36.7 C)-101.4 F (38.6 C)] 98.6 F (37 C) (12/22 0434) Pulse Rate:  [86-87] 86  (12/22 0434) Resp:  [18-20] 20  (12/22 0434) BP: (119-126)/(65-75) 126/65 mmHg (12/22 0434) SpO2:  [95 %-98 %] 95 % (12/22 0434)  Physical Exam  Constitutional: He is oriented to person, place, and time. He appears well-developed and well-nourished. No distress.  HENT:  Head: Normocephalic and atraumatic.  Nose: Nose normal.  Mouth/Throat:  Submandibular swelling has slightly increased. There is some erythema without fluctuance noted. There is swelling along interior of the left cheek without obvious palpable abscess. Left angle of the mandible is tender to touch. He has no stridor or difficulty managing his secretions at this time.Patient does have mild trismus.  Eyes: Conjunctivae and EOM are normal. Pupils are equal, round, and reactive to light.  Neck: Normal range of motion.  Abdominal: Soft. Bowel sounds are normal. He exhibits no distension. There is no tenderness. There is no rebound and no guarding.  Musculoskeletal: Normal range of motion.  Neurological: He is alert and oriented to person, place, and time. No cranial nerve deficit. He exhibits normal muscle tone. Coordination normal.  Skin: Skin is warm and dry. No rash noted.  Psychiatric: He has a normal mood and affect.     Basename 12/23/10 0630 12/22/10 0630  WBC 10.3 13.5*  HGB 11.2* 12.2*  HCT 32.7* 35.3*  PLT 243 258    Basename 12/22/10 0630 12/21/10 1241  NA 133* 140  K 3.3* 3.7  CL 100 105  CO2 25 --  GLUCOSE 99 75  BUN 3* 7  CREATININE 0.78 1.10  CALCIUM 9.0 --    Medications:  I have reviewed the patient's current medications. Scheduled:   . ampicillin-sulbactam (UNASYN) IV  1.5 g Intravenous NOW  . ampicillin-sulbactam (UNASYN) IV  1.5 g Intravenous Q6H  . vancomycin   1,500 mg Intravenous Q12H  . zolpidem  5 mg Oral QHS    Assessment/Plan: Left neck cellulitis/possible abscess secondary to an acute nondisplaced left mandibular fracture. Continue with IV antibiotics. Consider switching to Clindamycin to cover his oral anaerobes. Pt should be on liquid diet for 6 weeks. If not clinically improved by later today, may need a repeat CT scan to check for drainable abscess. Will follow.    LOS: 2 days   Kalayna Noy,SUI W 12/23/2010, 7:47 AM

## 2010-12-23 ENCOUNTER — Encounter (HOSPITAL_COMMUNITY): Payer: Self-pay | Admitting: Radiology

## 2010-12-23 ENCOUNTER — Inpatient Hospital Stay (HOSPITAL_COMMUNITY): Payer: Self-pay

## 2010-12-23 LAB — CBC
Hemoglobin: 11.2 g/dL — ABNORMAL LOW (ref 13.0–17.0)
MCHC: 34.3 g/dL (ref 30.0–36.0)
RDW: 13.4 % (ref 11.5–15.5)

## 2010-12-23 LAB — DIFFERENTIAL
Basophils Absolute: 0 10*3/uL (ref 0.0–0.1)
Basophils Relative: 0 % (ref 0–1)
Monocytes Relative: 9 % (ref 3–12)
Neutro Abs: 6.7 10*3/uL (ref 1.7–7.7)
Neutrophils Relative %: 65 % (ref 43–77)

## 2010-12-23 LAB — BASIC METABOLIC PANEL
Chloride: 100 mEq/L (ref 96–112)
GFR calc Af Amer: >90 mL/min (ref >90)
Potassium: 3.2 mEq/L — ABNORMAL LOW (ref 3.5–5.1)

## 2010-12-23 LAB — GLUCOSE, CAPILLARY: Glucose-Capillary: 79 mg/dL (ref 70–99)

## 2010-12-23 MED ORDER — POTASSIUM CHLORIDE CRYS ER 20 MEQ PO TBCR
40.0000 meq | EXTENDED_RELEASE_TABLET | Freq: Once | ORAL | Status: AC
  Administered 2010-12-23: 40 meq via ORAL
  Filled 2010-12-23: qty 2

## 2010-12-23 MED ORDER — IOHEXOL 300 MG/ML  SOLN
75.0000 mL | Freq: Once | INTRAMUSCULAR | Status: AC | PRN
  Administered 2010-12-23: 75 mL via INTRAVENOUS

## 2010-12-23 NOTE — Progress Notes (Signed)
PATIENT DETAILS Name: James Webb Age: 20 y.o. Sex: male Date of Birth: 1990-01-29 Admit Date: 12/21/2010 PCP:No primary provider on file.  Subjective: Still having some pain, swelling almost unchanged  Objective: Vital signs in last 24 hours: Filed Vitals:   12/22/10 0500 12/22/10 1300 12/22/10 2145 12/23/10 0434  BP: 124/73 124/75 119/73 126/65  Pulse: 82 87 86 86  Temp: 101.3 F (38.5 C) 98 F (36.7 C) 101.4 F (38.6 C) 98.6 F (37 C)  TempSrc: Oral Axillary Oral Oral  Resp: 18 18  20   Height:      Weight:      SpO2: 96% 96% 98% 95%    Weight change:   Body mass index is 21.92 kg/(m^2).  Intake/Output from previous day:  Intake/Output Summary (Last 24 hours) at 12/23/10 1356 Last data filed at 12/23/10 0951  Gross per 24 hour  Intake 3441.25 ml  Output   2000 ml  Net 1441.25 ml    PHYSICAL EXAM: Gen Exam: Awake and alert with clear speech.   Neck: Supple, No JVD.   HEENT-painfull swelling in the sub-mental area and left submandibular area. Chest: B/L Clear.   CVS: S1 S2 Regular, no murmurs.  Abdomen: soft, BS +, non tender, non distended.  Extremities: no edema, lower extremities warm to touch. Neurologic: Non Focal.   Skin: No Rash.   Wounds: N/A.    CONSULTS:  ENT  LAB RESULTS: CBC  Lab 12/23/10 0630 12/22/10 0630 12/21/10 1241 12/21/10 1215  WBC 10.3 13.5* -- 10.9*  HGB 11.2* 12.2* 14.6 13.5  HCT 32.7* 35.3* 43.0 40.0  PLT 243 258 -- 268  MCV 84.3 84.0 -- 84.9  MCH 28.9 29.0 -- 28.7  MCHC 34.3 34.6 -- 33.8  RDW 13.4 13.5 -- 13.7  LYMPHSABS 2.5 -- -- 1.8  MONOABS 0.9 -- -- 0.9  EOSABS 0.1 -- -- 0.1  BASOSABS 0.0 -- -- 0.0  BANDABS -- -- -- --    Chemistries   Lab 12/23/10 0630 12/22/10 0630 12/21/10 2018 12/21/10 1241  NA 137 133* -- 140  K 3.2* 3.3* -- 3.7  CL 100 100 -- 105  CO2 29 25 -- --  GLUCOSE 92 99 -- 75  BUN <3* 3* -- 7  CREATININE 0.85 0.78 -- 1.10  CALCIUM 9.0 9.0 -- --  MG -- -- 1.9 --     GFR Estimated Creatinine Clearance: 141.3 ml/min (by C-G formula based on Cr of 0.85).  Coagulation profile No results found for this basename: INR:5,PROTIME:5 in the last 168 hours  Cardiac Enzymes No results found for this basename: CK:3,CKMB:3,TROPONINI:3,MYOGLOBIN:3 in the last 168 hours  No components found with this basename: POCBNP:3 No results found for this basename: DDIMER:2 in the last 72 hours No results found for this basename: HGBA1C:2 in the last 72 hours No results found for this basename: CHOL:2,HDL:2,LDLCALC:2,TRIG:2,CHOLHDL:2,LDLDIRECT:2 in the last 72 hours  Basename 12/21/10 2018  TSH 2.037  T4TOTAL --  T3FREE --  THYROIDAB --   No results found for this basename: VITAMINB12:2,FOLATE:2,FERRITIN:2,TIBC:2,IRON:2,RETICCTPCT:2 in the last 72 hours No results found for this basename: LIPASE:2,AMYLASE:2 in the last 72 hours  Urine Studies No results found for this basename: UACOL:2,UAPR:2,USPG:2,UPH:2,UTP:2,UGL:2,UKET:2,UBIL:2,UHGB:2,UNIT:2,UROB:2,ULEU:2,UEPI:2,UWBC:2,URBC:2,UBAC:2,CAST:2,CRYS:2,UCOM:2,BILUA:2 in the last 72 hours  MICROBIOLOGY: Recent Results (from the past 240 hour(s))  CULTURE, BLOOD (ROUTINE X 2)     Status: Normal (Preliminary result)   Collection Time   12/21/10 12:25 PM      Component Value Range Status Comment   Specimen Description BLOOD ARM RIGHT  Final    Special Requests BOTTLES DRAWN AEROBIC ONLY 5CC   Final    Setup Time [id number]   Final    Culture     Final    Value:        BLOOD CULTURE RECEIVED NO GROWTH TO DATE CULTURE WILL BE HELD FOR 5 DAYS BEFORE ISSUING A FINAL NEGATIVE REPORT   Report Status PENDING   Incomplete     RADIOLOGY STUDIES/RESULTS: Dg Orthopantogram  12/22/2010  *RADIOLOGY REPORT*  Clinical Data: Left jaw pain.  Larey Seat out of bed and hit face on table.  DG ORTHOPANTOGRAM/PANORAMIC  Comparison: CT neck 12/21/2010  Findings: Impacted left maxillary wisdom tooth (17) with an acute fracture extending  from the roots to the angle of the mandible. There is a lucency surrounding the tooth suggesting significant periapical disease. Periapical disease surrounding this tooth was present on prior CT.  Multiple missing teeth on the right  mandible, suspected absent 29, 30, and 32.  Impacted right maxillary wisdom tooth (1).  Significant dental caries tooth 15.  IMPRESSION: Impacted  left maxillary wisdom tooth (17) with significant periodontal disease, now with superimposed acute unilateral left mandibular fracture coursing obliquely from the roots towards the angle of the mandible.  See comments above.  Original Report Authenticated By: Elsie Stain, M.D.   Ct Soft Tissue Neck W Contrast  12/21/2010  *RADIOLOGY REPORT*  Clinical Data: 20 year old male with left-sided neck pain radiating to the jaw.  Suspect deep space neck infection.  CT NECK WITH CONTRAST  Technique:  Multidetector CT imaging of the neck was performed with intravenous contrast.  Contrast: 75mL OMNIPAQUE IOHEXOL 300 MG/ML IV SOLN  Comparison: None.  Findings: Lung apices are clear.  Negative visualized superior mediastinum.  Paucity of subcutaneous fat.  Hyperenhancing level I lymph nodes are mildly enlarged up to 9 mm short axis.  There is widespread indistinct fat stranding along the left and anterior neck, including the submental space.  Hyperenhancing level II nodes measure up to 17 mm in short axis.   Stranding surrounds both submandibular glands, greater on the left, but no definite sialoadenitis is noted.  The left platysma is asymmetrically thickened.  The parotid glands appear within normal limits.  The parapharyngeal and sublingual spaces are within normal limits. There may be a trace retropharyngeal effusion, but there is no drainable or organized fluid collection in the neck.  The adenoid and tonsillar pillars are hypertrophied.  The larynx and thyroid are normal.  Visualized major vascular structures are patent including both internal  jugular veins.  Negative visualized brain parenchyma and orbits. Visualized paranasal sinuses and mastoids are clear.  Large dental caries of the posterior left maxillary molar just anterior to the wisdom tooth.  Still, this does not seem to be the epicenter of the previously described inflammation.  No other acute osseous abnormality.  IMPRESSION: 1.  Severe cellulitis of the left face and neck.  Significant submental involvement with phlegmon, but no drainable or organized fluid collection or sublingual space extension. If the patient does not improve on the appropriate treatment as expected, recommend repeat imaging with attention to this area. 2.  Reactive lymphadenopathy; left level II and bilateral level I. 3.  Large dental caries left maxillary posterior molar for which dental follow up is recommended, but this does not seem to be the epicenter of the inflammatory process.  Original Report Authenticated By: Harley Hallmark, M.D.    MEDICATIONS: Scheduled Meds:    . ampicillin-sulbactam (UNASYN) IV  1.5 g Intravenous Q6H  . vancomycin  1,500 mg Intravenous Q12H  . zolpidem  5 mg Oral QHS   Continuous Infusions:    . sodium chloride 75 mL/hr at 12/23/10 0700   PRN Meds:.acetaminophen, morphine, ondansetron (ZOFRAN) IV, ondansetron, oxyCODONE, DISCONTD: morphine  Antibiotics: Anti-infectives     Start     Dose/Rate Route Frequency Ordered Stop   12/22/10 1600   ampicillin-sulbactam (UNASYN) 1.5 g in sodium chloride 0.9 % 50 mL IVPB        1.5 g 100 mL/hr over 30 Minutes Intravenous Every 6 hours 12/22/10 0856     12/22/10 0900   ampicillin-sulbactam (UNASYN) 1.5 g in sodium chloride 0.9 % 50 mL IVPB        1.5 g 100 mL/hr over 30 Minutes Intravenous NOW 12/22/10 0856 12/22/10 1224   12/22/10 0800   vancomycin (VANCOCIN) 1,500 mg in sodium chloride 0.9 % 500 mL IVPB        1,500 mg 250 mL/hr over 120 Minutes Intravenous Every 12 hours 12/22/10 0523     12/22/10 0530   vancomycin  (VANCOCIN) 500 mg in sodium chloride 0.9 % 100 mL IVPB        500 mg 100 mL/hr over 60 Minutes Intravenous  Once 12/22/10 0523 12/22/10 0720   12/21/10 2015   vancomycin (VANCOCIN) IVPB 1000 mg/200 mL premix  Status:  Discontinued        1,000 mg 200 mL/hr over 60 Minutes Intravenous Every 12 hours 12/21/10 2010 12/21/10 2028   12/21/10 1445   vancomycin (VANCOCIN) IVPB 1000 mg/200 mL premix        1,000 mg 200 mL/hr over 60 Minutes Intravenous  Once 12/21/10 1437 12/21/10 1607          Assessment/Plan: Patient Active Hospital Problem List: Cellulitis, Submental and Left Submandibular area   Assessment: Febrile overnight., essentially unchanged.No stridor.   Plan: Continue vancomycin and Unasyn. Will need a CT scan repeated at least by tomorrow if no improvement. Appreciate ENT followup.  Disposition: Remain Inpatient  Code Status: Full code  Maretta Bees,   MD. 12/23/2010, 1:56 PM

## 2010-12-24 ENCOUNTER — Inpatient Hospital Stay (HOSPITAL_COMMUNITY): Payer: Self-pay

## 2010-12-24 DIAGNOSIS — L0201 Cutaneous abscess of face: Secondary | ICD-10-CM

## 2010-12-24 DIAGNOSIS — L03211 Cellulitis of face: Secondary | ICD-10-CM

## 2010-12-24 LAB — BASIC METABOLIC PANEL
BUN: 3 mg/dL — ABNORMAL LOW (ref 6–23)
Chloride: 103 mEq/L (ref 96–112)
GFR calc Af Amer: >90 mL/min (ref >90)
Potassium: 3.6 mEq/L (ref 3.5–5.1)
Sodium: 138 mEq/L (ref 135–145)

## 2010-12-24 LAB — CBC
HCT: 32.8 % — ABNORMAL LOW (ref 39.0–52.0)
Hemoglobin: 11.1 g/dL — ABNORMAL LOW (ref 13.0–17.0)
RDW: 13.2 % (ref 11.5–15.5)
WBC: 6.5 10*3/uL (ref 4.0–10.5)

## 2010-12-24 MED ORDER — SODIUM CHLORIDE 0.9 % IV SOLN
500.0000 mg | Freq: Four times a day (QID) | INTRAVENOUS | Status: DC
  Administered 2010-12-24 – 2010-12-26 (×7): 500 mg via INTRAVENOUS
  Filled 2010-12-24 (×10): qty 500

## 2010-12-24 MED ORDER — CLINDAMYCIN PHOSPHATE 300 MG/50ML IV SOLN
300.0000 mg | Freq: Four times a day (QID) | INTRAVENOUS | Status: DC
  Administered 2010-12-24: 300 mg via INTRAVENOUS
  Filled 2010-12-24 (×3): qty 50

## 2010-12-24 MED ORDER — CLINDAMYCIN PHOSPHATE 900 MG/50ML IV SOLN
900.0000 mg | Freq: Three times a day (TID) | INTRAVENOUS | Status: DC
  Administered 2010-12-24 – 2010-12-26 (×5): 900 mg via INTRAVENOUS
  Filled 2010-12-24 (×8): qty 50

## 2010-12-24 MED ORDER — SODIUM CHLORIDE 0.9 % IV SOLN
500.0000 mg | INTRAVENOUS | Status: AC
  Administered 2010-12-24: 500 mg via INTRAVENOUS
  Filled 2010-12-24: qty 500

## 2010-12-24 NOTE — Progress Notes (Signed)
PATIENT DETAILS Name: James Webb Age: 20 y.o. Sex: male Date of Birth: 11-30-1990 Admit Date: 12/21/2010 PCP:No primary provider on file.  Subjective: Pain and swelling essentially unchanged  Objective: Vital signs in last 24 hours: Filed Vitals:   12/23/10 1900 12/23/10 2145 12/24/10 0500 12/24/10 0535  BP: 120/63 118/69 109/71   Pulse: 56 61 49 58  Temp: 97.6 F (36.4 C) 98.8 F (37.1 C) 97.9 F (36.6 C)   TempSrc: Oral Oral Oral   Resp: 16 20 20    Height:      Weight:      SpO2: 98% 99% 99%     Weight change:   Body mass index is 21.92 kg/(m^2).  Intake/Output from previous day:  Intake/Output Summary (Last 24 hours) at 12/24/10 1610 Last data filed at 12/24/10 0500  Gross per 24 hour  Intake   2962 ml  Output   1030 ml  Net   1932 ml    PHYSICAL EXAM: Gen Exam: Awake and alert with clear speech.  Difficulty opening mouth. Neck: Supple, No JVD.  No stridor heared HEENT-painfull swelling in the sub-mental area and left submandibular area.Tender to touch as well. Chest: B/L Clear.   CVS: S1 S2 Regular, no murmurs.  Abdomen: soft, BS +, non tender, non distended.  Extremities: no edema, lower extremities warm to touch. Neurologic: Non Focal.   Skin: No Rash.   Wounds: N/A.    CONSULTS:  ENT ID- pending  LAB RESULTS: CBC  Lab 12/24/10 0500 12/23/10 0630 12/22/10 0630 12/21/10 1241 12/21/10 1215  WBC 6.5 10.3 13.5* -- 10.9*  HGB 11.1* 11.2* 12.2* 14.6 13.5  HCT 32.8* 32.7* 35.3* 43.0 40.0  PLT 236 243 258 -- 268  MCV 84.3 84.3 84.0 -- 84.9  MCH 28.5 28.9 29.0 -- 28.7  MCHC 33.8 34.3 34.6 -- 33.8  RDW 13.2 13.4 13.5 -- 13.7  LYMPHSABS -- 2.5 -- -- 1.8  MONOABS -- 0.9 -- -- 0.9  EOSABS -- 0.1 -- -- 0.1  BASOSABS -- 0.0 -- -- 0.0  BANDABS -- -- -- -- --    Chemistries   Lab 12/24/10 0500 12/23/10 0630 12/22/10 0630 12/21/10 2018 12/21/10 1241  NA 138 137 133* -- 140  K 3.6 3.2* 3.3* -- 3.7  CL 103 100 100 -- 105  CO2 29 29 25  --  --  GLUCOSE 88 92 99 -- 75  BUN 3* <3* 3* -- 7  CREATININE 0.82 0.85 0.78 -- 1.10  CALCIUM 9.0 9.0 9.0 -- --  MG -- -- -- 1.9 --    GFR Estimated Creatinine Clearance: 146.5 ml/min (by C-G formula based on Cr of 0.82).  Coagulation profile No results found for this basename: INR:5,PROTIME:5 in the last 168 hours  Cardiac Enzymes No results found for this basename: CK:3,CKMB:3,TROPONINI:3,MYOGLOBIN:3 in the last 168 hours  No components found with this basename: POCBNP:3 No results found for this basename: DDIMER:2 in the last 72 hours No results found for this basename: HGBA1C:2 in the last 72 hours No results found for this basename: CHOL:2,HDL:2,LDLCALC:2,TRIG:2,CHOLHDL:2,LDLDIRECT:2 in the last 72 hours  Basename 12/21/10 2018  TSH 2.037  T4TOTAL --  T3FREE --  THYROIDAB --   No results found for this basename: VITAMINB12:2,FOLATE:2,FERRITIN:2,TIBC:2,IRON:2,RETICCTPCT:2 in the last 72 hours No results found for this basename: LIPASE:2,AMYLASE:2 in the last 72 hours  Urine Studies No results found for this basename: UACOL:2,UAPR:2,USPG:2,UPH:2,UTP:2,UGL:2,UKET:2,UBIL:2,UHGB:2,UNIT:2,UROB:2,ULEU:2,UEPI:2,UWBC:2,URBC:2,UBAC:2,CAST:2,CRYS:2,UCOM:2,BILUA:2 in the last 72 hours  MICROBIOLOGY: Recent Results (from the past 240 hour(s))  CULTURE, BLOOD (ROUTINE X  2)     Status: Normal (Preliminary result)   Collection Time   12/21/10 12:25 PM      Component Value Range Status Comment   Specimen Description BLOOD ARM RIGHT   Final    Special Requests BOTTLES DRAWN AEROBIC ONLY 5CC   Final    Setup Time [id number]   Final    Culture     Final    Value:        BLOOD CULTURE RECEIVED NO GROWTH TO DATE CULTURE WILL BE HELD FOR 5 DAYS BEFORE ISSUING A FINAL NEGATIVE REPORT   Report Status PENDING   Incomplete     RADIOLOGY STUDIES/RESULTS: Dg Orthopantogram  12/22/2010  *RADIOLOGY REPORT*  Clinical Data: Left jaw pain.  Larey Seat out of bed and hit face on table.  DG  ORTHOPANTOGRAM/PANORAMIC  Comparison: CT neck 12/21/2010  Findings: Impacted left maxillary wisdom tooth (17) with an acute fracture extending from the roots to the angle of the mandible. There is a lucency surrounding the tooth suggesting significant periapical disease. Periapical disease surrounding this tooth was present on prior CT.  Multiple missing teeth on the right  mandible, suspected absent 29, 30, and 32.  Impacted right maxillary wisdom tooth (1).  Significant dental caries tooth 15.  IMPRESSION: Impacted  left maxillary wisdom tooth (17) with significant periodontal disease, now with superimposed acute unilateral left mandibular fracture coursing obliquely from the roots towards the angle of the mandible.  See comments above.  Original Report Authenticated By: Elsie Stain, M.D.   Ct Soft Tissue Neck W Contrast  12/21/2010  *RADIOLOGY REPORT*  Clinical Data: 20 year old male with left-sided neck pain radiating to the jaw.  Suspect deep space neck infection.  CT NECK WITH CONTRAST  Technique:  Multidetector CT imaging of the neck was performed with intravenous contrast.  Contrast: 75mL OMNIPAQUE IOHEXOL 300 MG/ML IV SOLN  Comparison: None.  Findings: Lung apices are clear.  Negative visualized superior mediastinum.  Paucity of subcutaneous fat.  Hyperenhancing level I lymph nodes are mildly enlarged up to 9 mm short axis.  There is widespread indistinct fat stranding along the left and anterior neck, including the submental space.  Hyperenhancing level II nodes measure up to 17 mm in short axis.   Stranding surrounds both submandibular glands, greater on the left, but no definite sialoadenitis is noted.  The left platysma is asymmetrically thickened.  The parotid glands appear within normal limits.  The parapharyngeal and sublingual spaces are within normal limits. There may be a trace retropharyngeal effusion, but there is no drainable or organized fluid collection in the neck.  The adenoid and  tonsillar pillars are hypertrophied.  The larynx and thyroid are normal.  Visualized major vascular structures are patent including both internal jugular veins.  Negative visualized brain parenchyma and orbits. Visualized paranasal sinuses and mastoids are clear.  Large dental caries of the posterior left maxillary molar just anterior to the wisdom tooth.  Still, this does not seem to be the epicenter of the previously described inflammation.  No other acute osseous abnormality.  IMPRESSION: 1.  Severe cellulitis of the left face and neck.  Significant submental involvement with phlegmon, but no drainable or organized fluid collection or sublingual space extension. If the patient does not improve on the appropriate treatment as expected, recommend repeat imaging with attention to this area. 2.  Reactive lymphadenopathy; left level II and bilateral level I. 3.  Large dental caries left maxillary posterior molar for which dental follow up  is recommended, but this does not seem to be the epicenter of the inflammatory process.  Original Report Authenticated By: Harley Hallmark, M.D.    MEDICATIONS: Scheduled Meds:    . clindamycin (CLEOCIN) IV  300 mg Intravenous Q6H  . potassium chloride  40 mEq Oral Once  . vancomycin  1,500 mg Intravenous Q12H  . zolpidem  5 mg Oral QHS  . DISCONTD: ampicillin-sulbactam (UNASYN) IV  1.5 g Intravenous Q6H   Continuous Infusions:    . sodium chloride 75 mL/hr at 12/24/10 0258   PRN Meds:.acetaminophen, iohexol, morphine, ondansetron (ZOFRAN) IV, ondansetron, oxyCODONE  Antibiotics: Anti-infectives     Start     Dose/Rate Route Frequency Ordered Stop   12/24/10 0800   clindamycin (CLEOCIN) IVPB 300 mg        300 mg 100 mL/hr over 30 Minutes Intravenous 4 times per day 12/24/10 0745     12/22/10 1600   ampicillin-sulbactam (UNASYN) 1.5 g in sodium chloride 0.9 % 50 mL IVPB  Status:  Discontinued        1.5 g 100 mL/hr over 30 Minutes Intravenous Every 6 hours  12/22/10 0856 12/24/10 0745   12/22/10 0900   ampicillin-sulbactam (UNASYN) 1.5 g in sodium chloride 0.9 % 50 mL IVPB        1.5 g 100 mL/hr over 30 Minutes Intravenous NOW 12/22/10 0856 12/22/10 1224   12/22/10 0800   vancomycin (VANCOCIN) 1,500 mg in sodium chloride 0.9 % 500 mL IVPB        1,500 mg 250 mL/hr over 120 Minutes Intravenous Every 12 hours 12/22/10 0523     12/22/10 0530   vancomycin (VANCOCIN) 500 mg in sodium chloride 0.9 % 100 mL IVPB        500 mg 100 mL/hr over 60 Minutes Intravenous  Once 12/22/10 0523 12/22/10 0720   12/21/10 2015   vancomycin (VANCOCIN) IVPB 1000 mg/200 mL premix  Status:  Discontinued        1,000 mg 200 mL/hr over 60 Minutes Intravenous Every 12 hours 12/21/10 2010 12/21/10 2028   12/21/10 1445   vancomycin (VANCOCIN) IVPB 1000 mg/200 mL premix        1,000 mg 200 mL/hr over 60 Minutes Intravenous  Once 12/21/10 1437 12/21/10 1607          Assessment/Plan: Patient Active Hospital Problem List: Cellulitis, Submental and Left Submandibular area   Assessment:Afebrile overnight., essentially unchanged.No stridor.However repeat CT Scan done yesterday evening shows worsening cellulitis with Phlegmon-without a abscess.   Plan:Think this patient needs a I&D, he is on Unasyn and Vancomycin which should cover this sort of infection, spoke with Dr Suszanne Conners ENT on case, he will evaluate later this am. He suggested to broaden antibiotic coverage to include Clindamycin and suggested I speak with ID.I have also spoken with Dr Charlynne Cousins seemed to think that current antibiotic coverage was adequate and seemed to think that patient would need a I&D if infection was getting worse. He suggested to add Primaxin. So at this point I will stop Unasyn, add Primaxin and Clindamycin. Will continue with Vancomycin. I will await ENT and ID evaluation. Patient does not have strior on exam.   Disposition: Remain Inpatient  Code Status: Full code  Maretta Bees,    MD. 12/24/2010, 8:08 AM

## 2010-12-24 NOTE — Progress Notes (Signed)
ANTIBIOTIC CONSULT NOTE - INITIAL  Pharmacy Consult for Primaxin/vancomycin Indication: Facial cellulitis  No Known Allergies  Patient Measurements: Height: 6' (182.9 cm) (From Patient) Weight: 161 lb 9.6 oz (73.3 kg) IBW/kg (Calculated) : 77.6   Vital Signs: Temp: 97.9 F (36.6 C) (12/23 0500) Temp src: Oral (12/23 0500) BP: 109/71 mmHg (12/23 0500) Pulse Rate: 58  (12/23 0535) Intake/Output from previous day: 12/22 0701 - 12/23 0700 In: 2962 [P.O.:1320; I.V.:1642] Out: 1580 [Urine:1580] Intake/Output from this shift:    Labs:  Basename 12/24/10 0500 12/23/10 0630 12/22/10 0630  WBC 6.5 10.3 13.5*  HGB 11.1* 11.2* 12.2*  PLT 236 243 258  LABCREA -- -- --  CREATININE 0.82 0.85 0.78   Estimated Creatinine Clearance: 146.5 ml/min (by C-G formula based on Cr of 0.82). No results found for this basename: VANCOTROUGH:2,VANCOPEAK:2,VANCORANDOM:2,GENTTROUGH:2,GENTPEAK:2,GENTRANDOM:2,TOBRATROUGH:2,TOBRAPEAK:2,TOBRARND:2,AMIKACINPEAK:2,AMIKACINTROU:2,AMIKACIN:2, in the last 72 hours   Microbiology: Recent Results (from the past 720 hour(s))  CULTURE, BLOOD (ROUTINE X 2)     Status: Normal (Preliminary result)   Collection Time   12/21/10 12:25 PM      Component Value Range Status Comment   Specimen Description BLOOD ARM RIGHT   Final    Special Requests BOTTLES DRAWN AEROBIC ONLY 5CC   Final    Setup Time [id number]   Final    Culture     Final    Value:        BLOOD CULTURE RECEIVED NO GROWTH TO DATE CULTURE WILL BE HELD FOR 5 DAYS BEFORE ISSUING A FINAL NEGATIVE REPORT   Report Status PENDING   Incomplete     Medical History: Past Medical History  Diagnosis Date  . 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 Shortness of breath     SOB x 3 days    Medications:  Scheduled:    . clindamycin (CLEOCIN) IV  300 mg Intravenous Q6H  . imipenem-cilastatin  500 mg Intravenous NOW  . imipenem-cilastatin  500 mg Intravenous Q6H  . potassium chloride  40  mEq Oral Once  . vancomycin  1,500 mg Intravenous Q12H  . zolpidem  5 mg Oral QHS  . DISCONTD: ampicillin-sulbactam (UNASYN) IV  1.5 g Intravenous Q6H    Admit Complaint: Facial cellulitis Pharmacist System-Based Medication Review: Anticoagulation: none pta ID: Severe cellulitis of left face and neck, recent molar fillings but MD does not feel this is the source of the inflammation, Afebrile, WBC down from 13.5 to 10.3 to 6.5, BCx x2 NGTD ENT has seen- no surgery but wants to continue Vancomycin. Unasyn added to broaden coverage (fever, leukocytosis) now changed to Primaxin + clindamycin to include coverage for oral anaerobes. CT on 12/22 showed worsening edema, severe cellulitis but no evidence of abscess.  CARDS: VSS ENDO:: cbg wnl. TSH WNL NEURO: +opiate, tetrahydrocannabinol; pain scores improved to 5-7 from 8-9. Received morphine prn x 4 in last 24 hours + 5 doses of oxycodone. GI/NUTRITION: full liquid diet, LFTs WNL RENAL: SCr 0.82 stable/CrCL>100 ml/min, K+ 3.6    Assessment: 20 yo M with severe cellulitis of left face and neck on vancomycin and Unasyn to continue vancomycin and switch to Primaxin and clindamycin.  Afebrile, WBC trending down, BCx x2 NGTD, with no evidence of abscess on CT but with worsening edema.   Goal of Therapy:  Vancomycin trough level 10-15 mcg/ml  Plan:  1. Continue Vancomycin 1500mg  IV q12h (day #4) 2. Imipenem 500mg  IV q6h (day #1) 3. Vanc trough Monday (tomorrow) at 0730.  4. Monitor temp, renal fxn, WBC 5. Follow up culture results  Concha Norway 12/24/2010,8:42 AM

## 2010-12-24 NOTE — Progress Notes (Signed)
Subjective: Pt still c/o left neck/jaw pain. It hurts to open his mouth.  Objective: Vital signs in last 24 hours: Temp:  [97.6 F (36.4 C)-98.8 F (37.1 C)] 97.9 F (36.6 C) (12/23 0500) Pulse Rate:  [49-61] 58  (12/23 0535) Resp:  [16-20] 20  (12/23 0500) BP: (109-120)/(63-71) 109/71 mmHg (12/23 0500) SpO2:  [98 %-99 %] 99 % (12/23 0500)  Physical Exam  Constitutional: He is oriented to person, place, and time. He appears well-developed and well-nourished. No distress.  HENT:  Head: Normocephalic and atraumatic.  Nose: Nose normal.  Mouth/Throat:  Submandibular swelling is stable. There is some erythema without fluctuance noted. There is swelling along interior of the left cheek without obvious palpable abscess. Left angle of the mandible is tender to touch. He has no stridor or difficulty managing his secretions at this time.Patient does have mild trismus.  Eyes: Conjunctivae and EOM are normal. Pupils are equal, round, and reactive to light.  Neck: Normal range of motion.  Musculoskeletal: Normal range of motion.  Neurological: He is alert and oriented to person, place, and time. No cranial nerve deficit. He exhibits normal muscle tone. Coordination normal.  Skin: Skin is warm and dry. No rash noted.  Psychiatric: He has a normal mood and affect.    Basename 12/24/10 0500 12/23/10 0630  WBC 6.5 10.3  HGB 11.1* 11.2*  HCT 32.8* 32.7*  PLT 236 243    Basename 12/24/10 0500 12/23/10 0630  NA 138 137  K 3.6 3.2*  CL 103 100  CO2 29 29  GLUCOSE 88 92  BUN 3* <3*  CREATININE 0.82 0.85  CALCIUM 9.0 9.0    Medications:  I have reviewed the patient's current medications. Scheduled:   . clindamycin (CLEOCIN) IV  300 mg Intravenous Q6H  . imipenem-cilastatin  500 mg Intravenous NOW  . imipenem-cilastatin  500 mg Intravenous Q6H  . potassium chloride  40 mEq Oral Once  . vancomycin  1,500 mg Intravenous Q12H  . zolpidem  5 mg Oral QHS  . DISCONTD: ampicillin-sulbactam  (UNASYN) IV  1.5 g Intravenous Q6H    Assessment/Plan: Left neck cellulitis/phlegmon secondary to an acute nondisplaced left mandibular fracture.   CT from yesterday reviewed.  No drainable abscess at this time.  Surgery is not indicated at this time.  Agree with primaxin and clindamycin. Pt does have mild mandibular deviation to the right secondary to his left mandibular fracture.  Pt should be on liquid diet for 6 weeks. If his mandibular deviation persists, he will need mandibular-maxillary fixation after the infection is under control.  Will continue to follow.    LOS: 3 days   Ambrielle Kington,SUI W 12/24/2010, 12:32 PM

## 2010-12-24 NOTE — Consult Note (Signed)
Date of Admission:  12/21/2010  Date of Consult:  12/24/2010  Reason for Consult: Severe oropharyngeal infection with mandibular fracture not responding to antibiotics Referring Physician: Dr. Jerral Ralph   HPI: James Webb is an 20 y.o. male with past history of meningitis as a child, who has caries but no recent dental work he struck left side of his face on Tuesday and since then has developed worsening swelling of his neck, pain, difficulty eating foods, and subjective fevers. He had CT scan done on the 20th which showed significant oropharyngeal, parapharyngeal, submandibular infection and left mandibular fracture. He was placed on appropriate antibiotics with vancomycin and unasyn (though unsasyn could have been given at higher dose than the 1.5 grams q 6). Despite antibiotics he is experiencing increased swelling pain and at night is having trouble choking on his secretions. Repeat CT scan yesterday shows significant worsening of inflammation with  Worsening  edema involving the left side of the neck, with increased partial effacement of the oropharynx, and near-  complete effacement of the inferior hypopharynx."    Past Medical History  Diagnosis Date  . 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 Shortness of breath     SOB x 3 days    History reviewed. No pertinent past surgical history.ergies:   No Known Allergies   Medications: I have reviewed patients current medications as documented in Epic Anti-infectives     Start     Dose/Rate Route Frequency Ordered Stop   12/24/10 1500   imipenem-cilastatin (PRIMAXIN) 500 mg in sodium chloride 0.9 % 100 mL IVPB        500 mg 200 mL/hr over 30 Minutes Intravenous Every 6 hours 12/24/10 0856     12/24/10 0900   imipenem-cilastatin (PRIMAXIN) 500 mg in sodium chloride 0.9 % 100 mL IVPB        500 mg 200 mL/hr over 30 Minutes Intravenous NOW 12/24/10 0851 12/24/10 1100   12/24/10 0800   clindamycin (CLEOCIN) IVPB  300 mg        300 mg 100 mL/hr over 30 Minutes Intravenous 4 times per day 12/24/10 0745     12/22/10 1600   ampicillin-sulbactam (UNASYN) 1.5 g in sodium chloride 0.9 % 50 mL IVPB  Status:  Discontinued        1.5 g 100 mL/hr over 30 Minutes Intravenous Every 6 hours 12/22/10 0856 12/24/10 0745   12/22/10 0900   ampicillin-sulbactam (UNASYN) 1.5 g in sodium chloride 0.9 % 50 mL IVPB        1.5 g 100 mL/hr over 30 Minutes Intravenous NOW 12/22/10 0856 12/22/10 1224   12/22/10 0800   vancomycin (VANCOCIN) 1,500 mg in sodium chloride 0.9 % 500 mL IVPB        1,500 mg 250 mL/hr over 120 Minutes Intravenous Every 12 hours 12/22/10 0523     12/22/10 0530   vancomycin (VANCOCIN) 500 mg in sodium chloride 0.9 % 100 mL IVPB        500 mg 100 mL/hr over 60 Minutes Intravenous  Once 12/22/10 0523 12/22/10 0720   12/21/10 2015   vancomycin (VANCOCIN) IVPB 1000 mg/200 mL premix  Status:  Discontinued        1,000 mg 200 mL/hr over 60 Minutes Intravenous Every 12 hours 12/21/10 2010 12/21/10 2028   12/21/10 1445   vancomycin (VANCOCIN) IVPB 1000 mg/200 mL premix        1,000 mg 200 mL/hr over 60 Minutes Intravenous  Once 12/21/10 1437 12/21/10 1607          Social History:  reports that he has been smoking Cigarettes.  He has a 2.5 pack-year smoking history. He has never used smokeless tobacco. He reports that he drinks about 5.4 ounces of alcohol per week. He reports that he uses illicit drugs (Marijuana).  History reviewed. No pertinent family history.  As in HPI and primary teams notes otherwise 12 point review of systems is negative  Blood pressure 109/71, pulse 58, temperature 97.9 F (36.6 C), temperature source Oral, resp. rate 20, height 6' (1.829 m), weight 161 lb 9.6 oz (73.3 kg), SpO2 99.00%. General: Alert and awake, oriented x3, not in any acute distress. HEENT: significant swelling tenderness involving the left neck, submandibular area with tenderness along the mandible  itself. I cannot visualize his posterior oropharynx very well. He does not have stridor CVS regular rate, normal r,  no murmur rubs or gallops Chest: clear to auscultation bilaterally, no wheezing, rales or rhonchi Abdomen: soft nontender, nondistended, normal bowel sounds, Extremities: no  clubbing or edema noted bilaterally Skin: no rashes Neuro: nonfocal, strength and sensation intact   Results for orders placed during the hospital encounter of 12/21/10 (from the past 48 hour(s))  CBC     Status: Abnormal   Collection Time   12/23/10  6:30 AM      Component Value Range Comment   WBC 10.3  4.0 - 10.5 (K/uL)    RBC 3.88 (*) 4.22 - 5.81 (MIL/uL)    Hemoglobin 11.2 (*) 13.0 - 17.0 (g/dL)    HCT 16.1 (*) 09.6 - 52.0 (%)    MCV 84.3  78.0 - 100.0 (fL)    MCH 28.9  26.0 - 34.0 (pg)    MCHC 34.3  30.0 - 36.0 (g/dL)    RDW 04.5  40.9 - 81.1 (%)    Platelets 243  150 - 400 (K/uL)   DIFFERENTIAL     Status: Normal   Collection Time   12/23/10  6:30 AM      Component Value Range Comment   Neutrophils Relative 65  43 - 77 (%)    Neutro Abs 6.7  1.7 - 7.7 (K/uL)    Lymphocytes Relative 25  12 - 46 (%)    Lymphs Abs 2.5  0.7 - 4.0 (K/uL)    Monocytes Relative 9  3 - 12 (%)    Monocytes Absolute 0.9  0.1 - 1.0 (K/uL)    Eosinophils Relative 1  0 - 5 (%)    Eosinophils Absolute 0.1  0.0 - 0.7 (K/uL)    Basophils Relative 0  0 - 1 (%)    Basophils Absolute 0.0  0.0 - 0.1 (K/uL)   BASIC METABOLIC PANEL     Status: Abnormal   Collection Time   12/23/10  6:30 AM      Component Value Range Comment   Sodium 137  135 - 145 (mEq/L)    Potassium 3.2 (*) 3.5 - 5.1 (mEq/L)    Chloride 100  96 - 112 (mEq/L)    CO2 29  19 - 32 (mEq/L)    Glucose, Bld 92  70 - 99 (mg/dL)    BUN <3 (*) 6 - 23 (mg/dL)    Creatinine, Ser 9.14  0.50 - 1.35 (mg/dL)    Calcium 9.0  8.4 - 10.5 (mg/dL)    GFR calc non Af Amer >90  >90 (mL/min)    GFR calc Af Amer >90  >90 (  mL/min)   GLUCOSE, CAPILLARY     Status:  Normal   Collection Time   12/23/10  8:04 AM      Component Value Range Comment   Glucose-Capillary 79  70 - 99 (mg/dL)   CBC     Status: Abnormal   Collection Time   12/24/10  5:00 AM      Component Value Range Comment   WBC 6.5  4.0 - 10.5 (K/uL)    RBC 3.89 (*) 4.22 - 5.81 (MIL/uL)    Hemoglobin 11.1 (*) 13.0 - 17.0 (g/dL)    HCT 04.5 (*) 40.9 - 52.0 (%)    MCV 84.3  78.0 - 100.0 (fL)    MCH 28.5  26.0 - 34.0 (pg)    MCHC 33.8  30.0 - 36.0 (g/dL)    RDW 81.1  91.4 - 78.2 (%)    Platelets 236  150 - 400 (K/uL)   BASIC METABOLIC PANEL     Status: Abnormal   Collection Time   12/24/10  5:00 AM      Component Value Range Comment   Sodium 138  135 - 145 (mEq/L)    Potassium 3.6  3.5 - 5.1 (mEq/L)    Chloride 103  96 - 112 (mEq/L)    CO2 29  19 - 32 (mEq/L)    Glucose, Bld 88  70 - 99 (mg/dL)    BUN 3 (*) 6 - 23 (mg/dL)    Creatinine, Ser 9.56  0.50 - 1.35 (mg/dL)    Calcium 9.0  8.4 - 10.5 (mg/dL)    GFR calc non Af Amer >90  >90 (mL/min)    GFR calc Af Amer >90  >90 (mL/min)       Component Value Date/Time   SDES BLOOD ARM RIGHT 12/21/2010 1225   SPECREQUEST BOTTLES DRAWN AEROBIC ONLY 5CC 12/21/2010 1225   CULT        BLOOD CULTURE RECEIVED NO GROWTH TO DATE CULTURE WILL BE HELD FOR 5 DAYS BEFORE ISSUING A FINAL NEGATIVE REPORT 12/21/2010 1225   REPTSTATUS PENDING 12/21/2010 1225   Ct Soft Tissue Neck W Contrast  12/23/2010  *RADIOLOGY REPORT*  Clinical Data: Left jaw pain and swelling, with dysphagia.  CT NECK WITH CONTRAST  Technique:  Multidetector CT imaging of the neck was performed with intravenous contrast.  Contrast: 75mL OMNIPAQUE IOHEXOL 300 MG/ML IV SOLN  Comparison: CT of the neck performed 12/21/2010  Findings: Since the prior study, there has been significant interval increase in the degree of soft tissue edema involving the left palatine tonsils, parapharyngeal fat planes, and the submandibular region, with some degree of asymmetric edema involving the left  floor of the mouth, raising question for early Ludwig's angina.  At the level of the oropharynx, there is marked narrowing of the oropharynx, with left-sided mass effect.  There is some mass effect on the left valleculae, and effacement of the left piriform sinus. At the level of the inferior hypopharynx, the lumen appears occluded; this may be transient, but likely reflects recent underlying edema.  No definite focal abscess is identified.  Diffuse soft tissue edema is again noted about both sides of the neck, compatible with cellulitis as previously described.  There is mild asymmetric prominence of the left submandibular gland, and a few mildly prominent nodes are noted at the left angle of the mandible, measuring up to 1.1 cm in short axis.  Prominent nodes are noted bilaterally at the floor of the mouth, increased in size from the  prior study, measuring up to 1.2 cm in short axis. The largest node, at level II of the neck, has increased in size, measuring 1.9 cm in short axis.  There is no evidence of vascular compromise.  The visualized vasculature appears grossly unremarkable.  The parotid glands are grossly unremarkable in appearance.  The thyroid gland is within normal limits.  The superior mediastinum is grossly unremarkable. The visualized lung apices are clear.  The visualized portions of the brain are unremarkable.  The orbits are within normal limits.  The visualized paranasal sinuses and mastoid air cells are well-aerated.  No acute osseous abnormalities are identified.  Prominent dental caries and absent dentition are again noted.  IMPRESSION:  1.  Worsening soft tissue edema involving the left side of the neck, with increased partial effacement of the oropharynx, and near- complete effacement of the inferior hypopharynx.  Asymmetric edema involving the floor of the mouth raises question for early Ludwig's angina.  Would correlate for symptoms that suggest airway compromise. 2.  No evidence of  abscess. 3.  Diffuse severe cellulitis again noted, with diffuse phlegmon and worsening cervical lymphadenopathy, worse on the left. Prominent nodes noted at the floor of the mouth, increased in size from the prior study. 4.  Underlying prominent dental caries and absent dentition again noted; this does not appear to reflect the epicenter of the inflammatory process.  Findings were discussed with Dr. Elray Mcgregor at 09:53 p.m. on 12/23/2010.  Original Report Authenticated By: Tonia Ghent, M.D.     Recent Results (from the past 720 hour(s))  CULTURE, BLOOD (ROUTINE X 2)     Status: Normal (Preliminary result)   Collection Time   12/21/10 12:25 PM      Component Value Range Status Comment   Specimen Description BLOOD ARM RIGHT   Final    Special Requests BOTTLES DRAWN AEROBIC ONLY 5CC   Final    Setup Time [id number]   Final    Culture     Final    Value:        BLOOD CULTURE RECEIVED NO GROWTH TO DATE CULTURE WILL BE HELD FOR 5 DAYS BEFORE ISSUING A FINAL NEGATIVE REPORT   Report Status PENDING   Incomplete      Impression/Recommendation  20 year old with severe head and neck infection involving the oropharynx, retropharynx, submandibular space with a fracture of hte mandible not responding to appropriately broad antibotics:  1) Severe Head and Neck infection: By patients story it sounds as if the trauma that caused the fracture may have precipitated this severe infection. Alternatively he could have had a severe infection brewing with subacute osteomyelitis of mandible that was uncovered by the trauma. Regardless, his inflammation has NOT responded to unasyn and vancomycin which would together cover all of the "usual suspects" for this type of infection including, streptococci, anerobes and MRSA. He has been broadened to vancomycin, imipenem and clindamycin. Much of this is unnecessary as he should NOT have a pseudomonal infectino or infection with an ESBL in this space (what imipenem brings)  and he is already coverred for the anerobes by the unasyn.  I am very worried that his airway will very soon be in danger if this does not abate.  HIs failure to respond to appropriate antibiotics suggests that he will need surgery to clear this infection  As mentioned the unasyn was not as aggressively dosed as possible( would have gone to 3g q 6). So this could potentially explain his lack of  response (though I doubt this).   I will continue the imipenem and vancomycin, and it is reasonable to go with clindamycin to inhibit toxin production  I will call Dr. Suszanne Conners to discuss the case with hime  2) Screening: check hiv ab  Dr. Drue Second will be here 24, 25, 26, Dr. Maurice March 27, 28, 29 and Dr. Ninetta Lights 30. 31, 1  Thank you so much for this interesting consult,   Acey Lav 12/24/2010, 12:42 PM   [phone] (pager) [phone] (office)

## 2010-12-25 LAB — HIV ANTIBODY (ROUTINE TESTING W REFLEX): HIV: NONREACTIVE

## 2010-12-25 LAB — VANCOMYCIN, TROUGH: Vancomycin Tr: 8.1 ug/mL — ABNORMAL LOW (ref 10.0–20.0)

## 2010-12-25 LAB — C-REACTIVE PROTEIN: CRP: 9.17 mg/dL — ABNORMAL HIGH (ref <0.60)

## 2010-12-25 MED ORDER — VANCOMYCIN HCL 1000 MG IV SOLR
1250.0000 mg | Freq: Three times a day (TID) | INTRAVENOUS | Status: DC
  Administered 2010-12-25 – 2010-12-26 (×3): 1250 mg via INTRAVENOUS
  Filled 2010-12-25 (×4): qty 1250

## 2010-12-25 NOTE — Brief Op Note (Signed)
*   No surgery found *  10:54 AM  PATIENT:  James Webb  20 y.o. male  PRE-OPERATIVE DIAGNOSIS:  1) Left neck cellulitis/abscess 2) Left mandibular fracture  POST-OPERATIVE DIAGNOSIS:  1) Left neck cellulitis/abscess 2) Left mandibular fracture  PROCEDURE:  Aspiration and drainage of left neck abscess  SURGEON:  Vonzella Althaus Darrold Span Vinessa Macconnell  ANESTHESIA:   local  EBL:  minimal  LOCAL MEDICATIONS USED:  LIDOCAINE 2CC  SPECIMEN:  No Specimen  DISPOSITION OF SPECIMEN:  N/A  DICTATION: .Note written in EPIC  PLAN OF CARE: Remain an inpatient

## 2010-12-25 NOTE — Progress Notes (Signed)
Utilization review completed.  

## 2010-12-25 NOTE — Progress Notes (Signed)
PATIENT DETAILS Name: James Webb Age: 20 y.o. Sex: male Date of Birth: [birth year] Admit Date: 12/21/2010 PCP:No primary provider on file.  Subjective: Pain and swelling better today Complaining of mild to moderate pain in the small swelling below the right knee area-claims to be better than yesterday    .Objective: Vital signs in last 24 hours: Filed Vitals:   12/24/10 1744 12/24/10 2140 12/25/10 0514 12/25/10 1443  BP: 134/88 139/95 127/74 131/82  Pulse: 49 68 67 50  Temp: 99.4 F (37.4 C) 99.8 F (37.7 C) 99.4 F (37.4 C) 96.4 F (35.8 C)  TempSrc: Oral Oral Oral Oral  Resp: 16 20 20 20   Height:      Weight:      SpO2: 100% 100% 98% 99%    Weight change:   Body mass index is 21.92 kg/(m^2).  Intake/Output from previous day:  Intake/Output Summary (Last 24 hours) at 12/25/10 1651 Last data filed at 12/25/10 1500  Gross per 24 hour  Intake   1879 ml  Output   4160 ml  Net  -2281 ml    PHYSICAL EXAM: Gen Exam: Awake and alert with clear speech.  Difficulty opening mouth. Neck: Supple, No JVD.  No stridor heared HEENT-painfull swelling in the sub-mental area and left submandibular area.Tender to touch as well. Chest: B/L Clear.   CVS: S1 S2 Regular, no murmurs.  Abdomen: soft, BS +, non tender, non distended.  Extremities: no edema, lower extremities warm to touch. small swelling just below the  right patella area-moderately tender to palpation. No overlying erythema.  Neurologic: Non Focal.   Skin: No Rash.   Wounds: N/A.    CONSULTS:  ENT ID- pending  LAB RESULTS: CBC  Lab 12/24/10 0500 12/23/10 0630 12/22/10 0630 12/21/10 1241 12/21/10 1215  WBC 6.5 10.3 13.5* -- 10.9*  HGB 11.1* 11.2* 12.2* 14.6 13.5  HCT 32.8* 32.7* 35.3* 43.0 40.0  PLT 236 243 258 -- 268  MCV 84.3 84.3 84.0 -- 84.9  MCH 28.5 28.9 29.0 -- 28.7  MCHC 33.8 34.3 34.6 -- 33.8  RDW 13.2 13.4 13.5 -- 13.7  LYMPHSABS -- 2.5 -- -- 1.8  MONOABS -- 0.9 -- -- 0.9  EOSABS --  0.1 -- -- 0.1  BASOSABS -- 0.0 -- -- 0.0  BANDABS -- -- -- -- --    Chemistries   Lab 12/24/10 0500 12/23/10 0630 12/22/10 0630 12/21/10 2018 12/21/10 1241  NA 138 137 133* -- 140  K 3.6 3.2* 3.3* -- 3.7  CL 103 100 100 -- 105  CO2 29 29 25  -- --  GLUCOSE 88 92 99 -- 75  BUN 3* <3* 3* -- 7  CREATININE 0.82 0.85 0.78 -- 1.10  CALCIUM 9.0 9.0 9.0 -- --  MG -- -- -- 1.9 --    GFR Estimated Creatinine Clearance: 146.5 ml/min (by C-G formula based on Cr of 0.82).  Coagulation profile No results found for this basename: INR:5,PROTIME:5 in the last 168 hours  Cardiac Enzymes No results found for this basename: CK:3,CKMB:3,TROPONINI:3,MYOGLOBIN:3 in the last 168 hours  No components found with this basename: POCBNP:3 No results found for this basename: DDIMER:2 in the last 72 hours No results found for this basename: HGBA1C:2 in the last 72 hours No results found for this basename: CHOL:2,HDL:2,LDLCALC:2,TRIG:2,CHOLHDL:2,LDLDIRECT:2 in the last 72 hours No results found for this basename: TSH,T4TOTAL,FREET3,T3FREE,THYROIDAB in the last 72 hours No results found for this basename: VITAMINB12:2,FOLATE:2,FERRITIN:2,TIBC:2,IRON:2,RETICCTPCT:2 in the last 72 hours No results found for this basename: LIPASE:2,AMYLASE:2  in the last 72 hours  Urine Studies No results found for this basename: UACOL:2,UAPR:2,USPG:2,UPH:2,UTP:2,UGL:2,UKET:2,UBIL:2,UHGB:2,UNIT:2,UROB:2,ULEU:2,UEPI:2,UWBC:2,URBC:2,UBAC:2,CAST:2,CRYS:2,UCOM:2,BILUA:2 in the last 72 hours  MICROBIOLOGY: Recent Results (from the past 240 hour(s))  CULTURE, BLOOD (ROUTINE X 2)     Status: Normal (Preliminary result)   Collection Time   12/21/10 12:25 PM      Component Value Range Status Comment   Specimen Description BLOOD ARM RIGHT   Final    Special Requests BOTTLES DRAWN AEROBIC ONLY 5CC   Final    Setup Time [id number]   Final    Culture     Final    Value:        BLOOD CULTURE RECEIVED NO GROWTH TO DATE CULTURE WILL BE  HELD FOR 5 DAYS BEFORE ISSUING A FINAL NEGATIVE REPORT   Report Status PENDING   Incomplete     RADIOLOGY STUDIES/RESULTS: Dg Orthopantogram  12/22/2010  *RADIOLOGY REPORT*  Clinical Data: Left jaw pain.  Larey Seat out of bed and hit face on table.  DG ORTHOPANTOGRAM/PANORAMIC  Comparison: CT neck 12/21/2010  Findings: Impacted left maxillary wisdom tooth (17) with an acute fracture extending from the roots to the angle of the mandible. There is a lucency surrounding the tooth suggesting significant periapical disease. Periapical disease surrounding this tooth was present on prior CT.  Multiple missing teeth on the right  mandible, suspected absent 29, 30, and 32.  Impacted right maxillary wisdom tooth (1).  Significant dental caries tooth 15.  IMPRESSION: Impacted  left maxillary wisdom tooth (17) with significant periodontal disease, now with superimposed acute unilateral left mandibular fracture coursing obliquely from the roots towards the angle of the mandible.  See comments above.  Original Report Authenticated By: Elsie Stain, M.D.   Ct Soft Tissue Neck W Contrast  12/21/2010  *RADIOLOGY REPORT*  Clinical Data: 20 year old male with left-sided neck pain radiating to the jaw.  Suspect deep space neck infection.  CT NECK WITH CONTRAST  Technique:  Multidetector CT imaging of the neck was performed with intravenous contrast.  Contrast: 75mL OMNIPAQUE IOHEXOL 300 MG/ML IV SOLN  Comparison: None.  Findings: Lung apices are clear.  Negative visualized superior mediastinum.  Paucity of subcutaneous fat.  Hyperenhancing level I lymph nodes are mildly enlarged up to 9 mm short axis.  There is widespread indistinct fat stranding along the left and anterior neck, including the submental space.  Hyperenhancing level II nodes measure up to 17 mm in short axis.   Stranding surrounds both submandibular glands, greater on the left, but no definite sialoadenitis is noted.  The left platysma is asymmetrically  thickened.  The parotid glands appear within normal limits.  The parapharyngeal and sublingual spaces are within normal limits. There may be a trace retropharyngeal effusion, but there is no drainable or organized fluid collection in the neck.  The adenoid and tonsillar pillars are hypertrophied.  The larynx and thyroid are normal.  Visualized major vascular structures are patent including both internal jugular veins.  Negative visualized brain parenchyma and orbits. Visualized paranasal sinuses and mastoids are clear.  Large dental caries of the posterior left maxillary molar just anterior to the wisdom tooth.  Still, this does not seem to be the epicenter of the previously described inflammation.  No other acute osseous abnormality.  IMPRESSION: 1.  Severe cellulitis of the left face and neck.  Significant submental involvement with phlegmon, but no drainable or organized fluid collection or sublingual space extension. If the patient does not improve on the appropriate treatment as  expected, recommend repeat imaging with attention to this area. 2.  Reactive lymphadenopathy; left level II and bilateral level I. 3.  Large dental caries left maxillary posterior molar for which dental follow up is recommended, but this does not seem to be the epicenter of the inflammatory process.  Original Report Authenticated By: Harley Hallmark, M.D.    MEDICATIONS: Scheduled Meds:    . clindamycin (CLEOCIN) IV  900 mg Intravenous Q8H  . imipenem-cilastatin  500 mg Intravenous Q6H  . vancomycin  1,250 mg Intravenous Q8H  . zolpidem  5 mg Oral QHS  . DISCONTD: vancomycin  1,500 mg Intravenous Q12H   Continuous Infusions:    . sodium chloride 75 mL/hr at 12/25/10 1316   PRN Meds:.acetaminophen, morphine, ondansetron (ZOFRAN) IV, ondansetron, oxyCODONE  Antibiotics: Anti-infectives     Start     Dose/Rate Route Frequency Ordered Stop   12/25/10 1600   vancomycin (VANCOCIN) 1,250 mg in sodium chloride 0.9 % 250 mL  IVPB        1,250 mg 166.7 mL/hr over 90 Minutes Intravenous Every 8 hours 12/25/10 1348     12/24/10 2200   clindamycin (CLEOCIN) IVPB 900 mg        900 mg 100 mL/hr over 30 Minutes Intravenous 3 times per day 12/24/10 1257     12/24/10 1500   imipenem-cilastatin (PRIMAXIN) 500 mg in sodium chloride 0.9 % 100 mL IVPB        500 mg 200 mL/hr over 30 Minutes Intravenous Every 6 hours 12/24/10 0856     12/24/10 0900   imipenem-cilastatin (PRIMAXIN) 500 mg in sodium chloride 0.9 % 100 mL IVPB        500 mg 200 mL/hr over 30 Minutes Intravenous NOW 12/24/10 0851 12/24/10 1100   12/24/10 0800   clindamycin (CLEOCIN) IVPB 300 mg  Status:  Discontinued        300 mg 100 mL/hr over 30 Minutes Intravenous 4 times per day 12/24/10 0745 12/24/10 1257   12/22/10 1600   ampicillin-sulbactam (UNASYN) 1.5 g in sodium chloride 0.9 % 50 mL IVPB  Status:  Discontinued        1.5 g 100 mL/hr over 30 Minutes Intravenous Every 6 hours 12/22/10 0856 12/24/10 0745   12/22/10 0900   ampicillin-sulbactam (UNASYN) 1.5 g in sodium chloride 0.9 % 50 mL IVPB        1.5 g 100 mL/hr over 30 Minutes Intravenous NOW 12/22/10 0856 12/22/10 1224   12/22/10 0800   vancomycin (VANCOCIN) 1,500 mg in sodium chloride 0.9 % 500 mL IVPB  Status:  Discontinued        1,500 mg 250 mL/hr over 120 Minutes Intravenous Every 12 hours 12/22/10 0523 12/25/10 1346   12/22/10 0530   vancomycin (VANCOCIN) 500 mg in sodium chloride 0.9 % 100 mL IVPB        500 mg 100 mL/hr over 60 Minutes Intravenous  Once 12/22/10 0523 12/22/10 0720   12/21/10 2015   vancomycin (VANCOCIN) IVPB 1000 mg/200 mL premix  Status:  Discontinued        1,000 mg 200 mL/hr over 60 Minutes Intravenous Every 12 hours 12/21/10 2010 12/21/10 2028   12/21/10 1445   vancomycin (VANCOCIN) IVPB 1000 mg/200 mL premix        1,000 mg 200 mL/hr over 60 Minutes Intravenous  Once 12/21/10 1437 12/21/10 1607          Assessment/Plan: Patient Active Hospital  Problem List: Cellulitis, Submental and Left  Submandibular area   Assessment:Afebrile overnight., essentially unchanged.No stridor.However repeat CT Scan done  on 12/23/2010 for  evening shows worsening cellulitis with Phlegmon-without a abscess.   Plan: Bedside aspiration attempted by ENT, will continue with current antibiotics with plan to transition back to Unasyn.   Small infrapatellar swelling Assessment:? Small hematoma from falling Plan: Monitor, Ace wrap and cold or hot compresses. Already on broad-spectrum antibiotics which would cover for infection if it is-although doubt  Disposition: Remain Inpatient  Code Status: Full code  Maretta Bees,   MD. 12/25/2010, 4:51 PM

## 2010-12-25 NOTE — Progress Notes (Signed)
ANTIBIOTIC CONSULT NOTE - Follow-Up  Pharmacy Consult for Primaxin/vancomycin/Clindamycin Indication: Facial cellulitis  No Known Allergies  Patient Measurements: Height: 6' (182.9 cm) (From Patient) Weight: 161 lb 9.6 oz (73.3 kg) IBW/kg (Calculated) : 77.6   Vital Signs: Temp: 99.4 F (37.4 C) (12/24 0514) Temp src: Oral (12/24 0514) BP: 127/74 mmHg (12/24 0514) Pulse Rate: 67  (12/24 0514) Intake/Output from previous day: 12/23 0701 - 12/24 0700 In: 2439 [P.O.:500; I.V.:1439; IV Piggyback:500] Out: 2460 [Urine:2460] Intake/Output from this shift: Total I/O In: -  Out: 900 [Urine:900]  Labs:  [facility] 12/24/10 0500 12/23/10 0630  WBC 6.5 10.3  HGB 11.1* 11.2*  PLT 236 243  LABCREA -- --  CREATININE 0.82 0.85   Estimated Creatinine Clearance: 146.5 ml/min (by C-G formula based on Cr of 0.82).  Basename 12/25/10 0932  VANCOTROUGH 8.1*  VANCOPEAK --  Drue Dun --  GENTTROUGH --  GENTPEAK --  GENTRANDOM --  TOBRATROUGH --  TOBRAPEAK --  TOBRARND --  AMIKACINPEAK --  AMIKACINTROU --  AMIKACIN --     Microbiology: Recent Results (from the past 720 hour(s))  CULTURE, BLOOD (ROUTINE X 2)     Status: Normal (Preliminary result)   Collection Time   12/21/10 12:25 PM      Component Value Range Status Comment   Specimen Description BLOOD ARM RIGHT   Final    Special Requests BOTTLES DRAWN AEROBIC ONLY 5CC   Final    Setup Time [id number]   Final    Culture     Final    Value:        BLOOD CULTURE RECEIVED NO GROWTH TO DATE CULTURE WILL BE HELD FOR 5 DAYS BEFORE ISSUING A FINAL NEGATIVE REPORT   Report Status PENDING   Incomplete     Medical History: Past Medical History  Diagnosis Date  . 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 Shortness of breath     SOB x 3 days    Medications:  Scheduled:     . clindamycin (CLEOCIN) IV  900 mg Intravenous Q8H  . imipenem-cilastatin  500 mg Intravenous Q6H  . vancomycin  1,500 mg  Intravenous Q12H  . zolpidem  5 mg Oral QHS    Admit Complaint: Facial cellulitis  Pharmacist System-Based Medication Review:  Anticoagulation: none pta, now only on SCD's  ID: Severe cellulitis of left face and neck -Vancomycin trough = 8.1 mcg/ml.  Tmax - 99.8, most recent 99.4.  BCx x2 NGTD    CARDS: VSS  ENDO:: cbg wnl. TSH WNL  NEURO: Pain issues - Morphine/OXicodone  GI/NUTRITION: full liquid diet, LFTs WNL  RENAL: Stable   Goal of Therapy:  Vancomycin trough level 10-15 mcg/ml  Plan:  1. Change Vancomycin to 1250mg  IV q8 hours (Day #5).   2. Imipenem 500mg  IV q6h (day #2).  Continue Clindamycin (Day #2) 3. Recheck Vanc trough at steady state. 4. Monitor temp, renal fxn, WBC 5. Follow up culture results  Chinita Greenland 12/25/2010,1:39 PM

## 2010-12-25 NOTE — Progress Notes (Signed)
INFECTIOUS DISEASES PROGRESS NOTE Subjective:    Patient ID: James Webb, male    DOB: September 18, 1990, 20 y.o.   MRN: [id number] 20yo male with left mandibular fracture and left neck cellulitis/early abscess who was initially placed on amb/sub but low doses and had persistent swelling and  Dental Pain    24hr events: patient remains afebrile, he underwent bedside aspirate by ENT for small fluctuance below mandible. Aspirate reportedly mostly blood with small amount of purulent drainage. Patient's leukocytosis improved on imipenem and clindamycin. Jaw pain now 5/10 whereas yesterday 8 of 10   Abtx: imi #2 clinda #2  Review of Systems constipated. No bm since 5 days ago     Objective:   Physical Exam  BP 131/82  Pulse 50  Temp(Src) 96.4 F (35.8 C) (Oral)  Resp 20  Ht 6' (1.829 m)  Wt 161 lb 9.6 oz (73.3 kg)  BMI 21.92 kg/m2  SpO2 99% BP 131/82  Pulse 50  Temp(Src) 96.4 F (35.8 C) (Oral)  Resp 20  Ht 6' (1.829 m)  Wt 161 lb 9.6 oz (73.3 kg)  BMI 21.92 kg/m2  SpO2 99%  General Appearance:    Alert, cooperative, no distress, appears stated age  Head:    Normocephalic, without obvious abnormality, atraumatic  Eyes:    PERRL, conjunctiva/corneas clear, EOM's intact, fundi    benign, both eyes          Nose:   Nares normal, septum midline, mucosa normal, no drainage   or sinus tenderness  Throat:   Lips, mucosa, and tongue normal; teeth and gums normal; phonation is normal  Neck:   Supple, swelling below mandible. Bandage from ix d, no active drainage     Lungs:     Clear to auscultation bilaterally, respirations unlabored  Chest wall:    tattoos  Heart:    Regular rate and rhythm, S1 and S2 normal, no murmur, rub   or gallop  Abdomen:     Soft, non-tender, bowel sounds active all four quadrants,    no masses, no organomegaly        Extremities:   Extremities normal, atraumatic, no cyanosis or edema  Pulses:   2+ and symmetric all extremities  Skin:   Skin  color, texture, turgor normal, no rashes or lesions  Lymph nodes:   Cervical, supraclavicular, and axillary nodes normal  Neurologic:   CNII-XII intact. Normal strength, sensation and reflexes      throughout   Labs: CBC    Component Value Date/Time   WBC 6.5 12/24/2010 0500   RBC 3.89* 12/24/2010 0500   HGB 11.1* 12/24/2010 0500   HCT 32.8* 12/24/2010 0500   PLT 236 12/24/2010 0500   MCV 84.3 12/24/2010 0500   MCH 28.5 12/24/2010 0500   MCHC 33.8 12/24/2010 0500   RDW 13.2 12/24/2010 0500   LYMPHSABS 2.5 12/23/2010 0630   MONOABS 0.9 12/23/2010 0630   EOSABS 0.1 12/23/2010 0630   BASOSABS 0.0 12/23/2010 0630    CMP     Component Value Date/Time   NA 138 12/24/2010 0500   K 3.6 12/24/2010 0500   CL 103 12/24/2010 0500   CO2 29 12/24/2010 0500   GLUCOSE 88 12/24/2010 0500   BUN 3* 12/24/2010 0500   CREATININE 0.82 12/24/2010 0500   CALCIUM 9.0 12/24/2010 0500   PROT 7.2 12/21/2010 2018   ALBUMIN 3.2* 12/21/2010 2018   AST 18 12/21/2010 2018   ALT 12 12/21/2010 2018   ALKPHOS 49 12/21/2010 2018  BILITOT 0.7 12/21/2010 2018   GFRNONAA >90 12/24/2010 0500   GFRAA >90 12/24/2010 0500   Micro: Results for orders placed during the hospital encounter of 12/21/10  CULTURE, BLOOD (ROUTINE X 2)     Status: Normal (Preliminary result)   Collection Time   12/21/10 12:25 PM      Component Value Range Status Comment   Specimen Description BLOOD ARM RIGHT   Final    Special Requests BOTTLES DRAWN AEROBIC ONLY 5CC   Final    Setup Time [id number]   Final    Culture     Final    Value:        BLOOD CULTURE RECEIVED NO GROWTH TO DATE CULTURE WILL BE HELD FOR 5 DAYS BEFORE ISSUING A FINAL NEGATIVE REPORT   Report Status PENDING   Incomplete        Assessment & Plan:  20yo Male with cellulitis and small early abscess s/p aspiration on clindamycin and imipenem improved WBC and symptoms;  has decreased pain,  - would discontinue clindamycin tomorrow, and can change back to  unasyn 3gm dosing tomorrow since he is improving. Would consider switching to oral regimen of amox/clav and treat for total of 14 days.    Aram Beecham Kutler Vanvranken MD Infectious diseases [phone]

## 2010-12-25 NOTE — Progress Notes (Signed)
Subjective: Pt reports improvement in his jaw and neck pain.  Tolerating liquid diet. No breathing difficulty.  Objective: Vital signs in last 24 hours: Temp:  [99.4 F (37.4 C)-99.8 F (37.7 C)] 99.4 F (37.4 C) (12/24 0514) Pulse Rate:  [49-68] 67  (12/24 0514) Resp:  [16-20] 20  (12/24 0514) BP: (127-139)/(74-95) 127/74 mmHg (12/24 0514) SpO2:  [98 %-100 %] 98 % (12/24 0514)  Physical Exam  Constitutional: He is oriented to person, place, and time. He appears well-developed and well-nourished. No distress.  HENT:  Head: Normocephalic and atraumatic.  Nose: Nose normal.  Mouth/Throat:  Submandibular swelling has decreased. There is a small area of fluctuance noted at the angle of mandible. Left angle of the mandible is tender to touch. He has no stridor or difficulty managing his secretions at this time.Patient does have mild trismus.  Eyes: Conjunctivae and EOM are normal. Pupils are equal, round, and reactive to light.  Neck: Normal range of motion.  Musculoskeletal: Normal range of motion.  Neurological: He is alert and oriented to person, place, and time. No cranial nerve deficit. He exhibits normal muscle tone. Coordination normal.  Skin: Skin is warm and dry. No rash noted.  Psychiatric: He has a normal mood and affect.    Basename 12/24/10 0500 12/23/10 0630  WBC 6.5 10.3  HGB 11.1* 11.2*  HCT 32.8* 32.7*  PLT 236 243    Basename 12/24/10 0500 12/23/10 0630  NA 138 137  K 3.6 3.2*  CL 103 100  CO2 29 29  GLUCOSE 88 92  BUN 3* <3*  CREATININE 0.82 0.85  CALCIUM 9.0 9.0    Medications:  I have reviewed the patient's current medications. Scheduled:   . clindamycin (CLEOCIN) IV  900 mg Intravenous Q8H  . imipenem-cilastatin  500 mg Intravenous NOW  . imipenem-cilastatin  500 mg Intravenous Q6H  . vancomycin  1,500 mg Intravenous Q12H  . zolpidem  5 mg Oral QHS  . DISCONTD: clindamycin (CLEOCIN) IV  300 mg Intravenous Q6H    Procedure: Aspiration and  drainage of left neck abscess.   Anesthesia: local anesthesia with 1% lidocaine with epinephrine.   Description: The patient is placed upright on the hospital bed.  After adequate local anesthesia is achieved, an 18-G needle is used to make multiple passes over the left submandibular area and around the angle of mandible.  Approximately 0.2 cc of purulent fluid was evacuated.  The patient tolerated the procedure well.    Assessment/Plan: Left neck cellulitis/phlegmonabscess secondary to an acute nondisplaced left mandibular fracture.   A small area of fluctuance was aspirated at bedside. Only minimal purulent fluid was aspirated.  The patient has clinically improved with primaxin and clindamycin. Pt does have mild mandibular deviation to the right secondary to his left mandibular fracture. Pt should be on liquid diet for 6 weeks. If his mandibular deviation persists, he will need mandibular-maxillary fixation after the infection is under control.  Will continue to follow.    LOS: 4 days   Vergie Zahm,SUI W 12/25/2010, 10:47 AM

## 2010-12-26 LAB — CBC
MCH: 28.8 pg (ref 26.0–34.0)
MCHC: 34.7 g/dL (ref 30.0–36.0)
MCV: 83.2 fL (ref 78.0–100.0)
Platelets: 323 10*3/uL (ref 150–400)
RDW: 13.1 % (ref 11.5–15.5)
WBC: 7.7 10*3/uL (ref 4.0–10.5)

## 2010-12-26 MED ORDER — SODIUM CHLORIDE 0.9 % IV SOLN
3.0000 g | Freq: Four times a day (QID) | INTRAVENOUS | Status: DC
  Administered 2010-12-26 – 2010-12-28 (×8): 3 g via INTRAVENOUS
  Filled 2010-12-26 (×11): qty 3

## 2010-12-26 NOTE — Progress Notes (Addendum)
PATIENT DETAILS Name: James Webb Age: 20 y.o. Sex: male Date of Birth: 1990-06-08 Admit Date: 12/21/2010 PCP:No primary provider on file.  Interval History: James Webb  is a 20 year old male who has no significant past medical history, who prior to admission fall and hit his face on the left side, subsequently started having left neck, left submandibular area and swelling. He was also afebrile. On admission a CT scan of the neck region showed severe cellulitis of the left neck and face, he also had a phlegmon on the CT scan but there was no drainable abscess. Patient was admitted to the hospital and started on vancomycin and Unasyn. His fever slowly started to abate, however clinically he was almost unchanged, a CT scan of the soft tissue of the neck was again repeated on 12/21/2010, which showed worsening of his cellulitis. Infectious disease was consulted, antibiotics were then changed over to vancomycin clindamycin and Primaxin, ENT then performed a bedside aspiration yesterday. Clinically he is now improving. Current plans are to transition back to the Unasyn, watch him for the next 24-48 hours and then discharge him on Augmentin.  Subjective: Pain and swelling-continue to improve. Pain and swelling on the right infrapatellar area has completely resolved. Patient now able to flex his right leg completely and is ambulatory.   Objective: Vital signs in last 24 hours: Filed Vitals:   12/25/10 0514 12/25/10 1443 12/25/10 2150 12/26/10 0545  BP: 127/74 131/82 130/79 126/84  Pulse: 67 50 48 56  Temp: 99.4 F (37.4 C) 96.4 F (35.8 C) 96.9 F (36.1 C) 97 F (36.1 C)  TempSrc: Oral Oral Oral Oral  Resp: 20 20 20 20   Height:      Weight:      SpO2: 98% 99% 99% 99%    Weight change:   Body mass index is 21.92 kg/(m^2).  Intake/Output from previous day:  Intake/Output Summary (Last 24 hours) at 12/26/10 1020 Last data filed at 12/26/10 0545  Gross per 24 hour  Intake    1620 ml  Output   3200 ml  Net  -1580 ml    PHYSICAL EXAM: Gen Exam: Awake and alert with clear speech.  Difficulty opening mouth. Neck: Supple, No JVD.  No stridor heared HEENT-painfull swelling in the sub-mental area and left submandibular area.Tender to touch as well. Chest: B/L Clear.   CVS: S1 S2 Regular, no murmurs.  Abdomen: soft, BS +, non tender, non distended.  Extremities: no edema, lower extremities warm to touch. small swelling just below the  right patella area-moderately tender to palpation. No overlying erythema.  Neurologic: Non Focal.   Skin: No Rash.   Wounds: N/A.    CONSULTS:  ENT ID- pending  LAB RESULTS: CBC  Lab 12/24/10 0500 12/23/10 0630 12/22/10 0630 12/21/10 1241 12/21/10 1215  WBC 6.5 10.3 13.5* -- 10.9*  HGB 11.1* 11.2* 12.2* 14.6 13.5  HCT 32.8* 32.7* 35.3* 43.0 40.0  PLT 236 243 258 -- 268  MCV 84.3 84.3 84.0 -- 84.9  MCH 28.5 28.9 29.0 -- 28.7  MCHC 33.8 34.3 34.6 -- 33.8  RDW 13.2 13.4 13.5 -- 13.7  LYMPHSABS -- 2.5 -- -- 1.8  MONOABS -- 0.9 -- -- 0.9  EOSABS -- 0.1 -- -- 0.1  BASOSABS -- 0.0 -- -- 0.0  BANDABS -- -- -- -- --    Chemistries   Lab 12/24/10 0500 12/23/10 0630 12/22/10 0630 12/21/10 2018 12/21/10 1241  NA 138 137 133* -- 140  K 3.6 3.2* 3.3* -- 3.7  CL 103 100 100 -- 105  CO2 29 29 25  -- --  GLUCOSE 88 92 99 -- 75  BUN 3* <3* 3* -- 7  CREATININE 0.82 0.85 0.78 -- 1.10  CALCIUM 9.0 9.0 9.0 -- --  MG -- -- -- 1.9 --    GFR Estimated Creatinine Clearance: 146.5 ml/min (by C-G formula based on Cr of 0.82).  Coagulation profile No results found for this basename: INR:5,PROTIME:5 in the last 168 hours  Cardiac Enzymes No results found for this basename: CK:3,CKMB:3,TROPONINI:3,MYOGLOBIN:3 in the last 168 hours  No components found with this basename: POCBNP:3 No results found for this basename: DDIMER:2 in the last 72 hours No results found for this basename: HGBA1C:2 in the last 72 hours No results found for  this basename: CHOL:2,HDL:2,LDLCALC:2,TRIG:2,CHOLHDL:2,LDLDIRECT:2 in the last 72 hours No results found for this basename: TSH,T4TOTAL,FREET3,T3FREE,THYROIDAB in the last 72 hours No results found for this basename: VITAMINB12:2,FOLATE:2,FERRITIN:2,TIBC:2,IRON:2,RETICCTPCT:2 in the last 72 hours No results found for this basename: LIPASE:2,AMYLASE:2 in the last 72 hours  Urine Studies No results found for this basename: UACOL:2,UAPR:2,USPG:2,UPH:2,UTP:2,UGL:2,UKET:2,UBIL:2,UHGB:2,UNIT:2,UROB:2,ULEU:2,UEPI:2,UWBC:2,URBC:2,UBAC:2,CAST:2,CRYS:2,UCOM:2,BILUA:2 in the last 72 hours  MICROBIOLOGY: Recent Results (from the past 240 hour(s))  CULTURE, BLOOD (ROUTINE X 2)     Status: Normal (Preliminary result)   Collection Time   12/21/10 12:25 PM      Component Value Range Status Comment   Specimen Description BLOOD ARM RIGHT   Final    Special Requests BOTTLES DRAWN AEROBIC ONLY 5CC   Final    Setup Time [id number]   Final    Culture     Final    Value:        BLOOD CULTURE RECEIVED NO GROWTH TO DATE CULTURE WILL BE HELD FOR 5 DAYS BEFORE ISSUING A FINAL NEGATIVE REPORT   Report Status PENDING   Incomplete     RADIOLOGY STUDIES/RESULTS: Dg Orthopantogram  12/22/2010  *RADIOLOGY REPORT*  Clinical Data: Left jaw pain.  Larey Seat out of bed and hit face on table.  DG ORTHOPANTOGRAM/PANORAMIC  Comparison: CT neck 12/21/2010  Findings: Impacted left maxillary wisdom tooth (17) with an acute fracture extending from the roots to the angle of the mandible. There is a lucency surrounding the tooth suggesting significant periapical disease. Periapical disease surrounding this tooth was present on prior CT.  Multiple missing teeth on the right  mandible, suspected absent 29, 30, and 32.  Impacted right maxillary wisdom tooth (1).  Significant dental caries tooth 15.  IMPRESSION: Impacted  left maxillary wisdom tooth (17) with significant periodontal disease, now with superimposed acute unilateral left  mandibular fracture coursing obliquely from the roots towards the angle of the mandible.  See comments above.  Original Report Authenticated By: Elsie Stain, M.D.   Ct Soft Tissue Neck W Contrast  12/21/2010  *RADIOLOGY REPORT*  Clinical Data: 20 year old male with left-sided neck pain radiating to the jaw.  Suspect deep space neck infection.  CT NECK WITH CONTRAST  Technique:  Multidetector CT imaging of the neck was performed with intravenous contrast.  Contrast: 75mL OMNIPAQUE IOHEXOL 300 MG/ML IV SOLN  Comparison: None.  Findings: Lung apices are clear.  Negative visualized superior mediastinum.  Paucity of subcutaneous fat.  Hyperenhancing level I lymph nodes are mildly enlarged up to 9 mm short axis.  There is widespread indistinct fat stranding along the left and anterior neck, including the submental space.  Hyperenhancing level II nodes measure up to 17 mm in short axis.   Stranding surrounds both submandibular glands, greater on  the left, but no definite sialoadenitis is noted.  The left platysma is asymmetrically thickened.  The parotid glands appear within normal limits.  The parapharyngeal and sublingual spaces are within normal limits. There may be a trace retropharyngeal effusion, but there is no drainable or organized fluid collection in the neck.  The adenoid and tonsillar pillars are hypertrophied.  The larynx and thyroid are normal.  Visualized major vascular structures are patent including both internal jugular veins.  Negative visualized brain parenchyma and orbits. Visualized paranasal sinuses and mastoids are clear.  Large dental caries of the posterior left maxillary molar just anterior to the wisdom tooth.  Still, this does not seem to be the epicenter of the previously described inflammation.  No other acute osseous abnormality.  IMPRESSION: 1.  Severe cellulitis of the left face and neck.  Significant submental involvement with phlegmon, but no drainable or organized fluid  collection or sublingual space extension. If the patient does not improve on the appropriate treatment as expected, recommend repeat imaging with attention to this area. 2.  Reactive lymphadenopathy; left level II and bilateral level I. 3.  Large dental caries left maxillary posterior molar for which dental follow up is recommended, but this does not seem to be the epicenter of the inflammatory process.  Original Report Authenticated By: Harley Hallmark, M.D.    MEDICATIONS: Scheduled Meds:    . ampicillin-sulbactam (UNASYN) IV  3 g Intravenous Q6H  . zolpidem  5 mg Oral QHS  . DISCONTD: clindamycin (CLEOCIN) IV  900 mg Intravenous Q8H  . DISCONTD: imipenem-cilastatin  500 mg Intravenous Q6H  . DISCONTD: vancomycin  1,250 mg Intravenous Q8H  . DISCONTD: vancomycin  1,500 mg Intravenous Q12H   Continuous Infusions:    . sodium chloride 50 mL/hr at 12/26/10 0545   PRN Meds:.acetaminophen, morphine, ondansetron (ZOFRAN) IV, ondansetron, oxyCODONE  Antibiotics: Anti-infectives     Start     Dose/Rate Route Frequency Ordered Stop   12/26/10 1100  Ampicillin-Sulbactam (UNASYN) 3 g in sodium chloride 0.9 % 100 mL IVPB       3 g 100 mL/hr over 60 Minutes Intravenous Every 6 hours 12/26/10 1012     12/25/10 1600   vancomycin (VANCOCIN) 1,250 mg in sodium chloride 0.9 % 250 mL IVPB  Status:  Discontinued        1,250 mg 166.7 mL/hr over 90 Minutes Intravenous Every 8 hours 12/25/10 1348 12/26/10 1011   12/24/10 2200   clindamycin (CLEOCIN) IVPB 900 mg  Status:  Discontinued        900 mg 100 mL/hr over 30 Minutes Intravenous 3 times per day 12/24/10 1257 12/26/10 1011   12/24/10 1500   imipenem-cilastatin (PRIMAXIN) 500 mg in sodium chloride 0.9 % 100 mL IVPB  Status:  Discontinued        500 mg 200 mL/hr over 30 Minutes Intravenous Every 6 hours 12/24/10 0856 12/26/10 1011   12/24/10 0900   imipenem-cilastatin (PRIMAXIN) 500 mg in sodium chloride 0.9 % 100 mL IVPB        500 mg 200  mL/hr over 30 Minutes Intravenous NOW 12/24/10 0851 12/24/10 1100   12/24/10 0800   clindamycin (CLEOCIN) IVPB 300 mg  Status:  Discontinued        300 mg 100 mL/hr over 30 Minutes Intravenous 4 times per day 12/24/10 0745 12/24/10 1257   12/22/10 1600   ampicillin-sulbactam (UNASYN) 1.5 g in sodium chloride 0.9 % 50 mL IVPB  Status:  Discontinued  1.5 g 100 mL/hr over 30 Minutes Intravenous Every 6 hours 12/22/10 0856 12/24/10 0745   12/22/10 0900   ampicillin-sulbactam (UNASYN) 1.5 g in sodium chloride 0.9 % 50 mL IVPB        1.5 g 100 mL/hr over 30 Minutes Intravenous NOW 12/22/10 0856 12/22/10 1224   12/22/10 0800   vancomycin (VANCOCIN) 1,500 mg in sodium chloride 0.9 % 500 mL IVPB  Status:  Discontinued        1,500 mg 250 mL/hr over 120 Minutes Intravenous Every 12 hours 12/22/10 0523 12/25/10 1346   12/22/10 0530   vancomycin (VANCOCIN) 500 mg in sodium chloride 0.9 % 100 mL IVPB        500 mg 100 mL/hr over 60 Minutes Intravenous  Once 12/22/10 0523 12/22/10 0720   12/21/10 2015   vancomycin (VANCOCIN) IVPB 1000 mg/200 mL premix  Status:  Discontinued        1,000 mg 200 mL/hr over 60 Minutes Intravenous Every 12 hours 12/21/10 2010 12/21/10 2028   12/21/10 1445   vancomycin (VANCOCIN) IVPB 1000 mg/200 mL premix        1,000 mg 200 mL/hr over 60 Minutes Intravenous  Once 12/21/10 1437 12/21/10 1607          Assessment/Plan: Patient Active Hospital Problem List: Cellulitis, Submental and Left Submandibular area   Assessment: Significantly better    Plan: We'll transition back to Unasyn today and continue to monitor for 24-48 hours.  Small infrapatellar swelling Assessment:? Small hematoma from falling. This is completely resolved now. Plan: Monitor, Ace wrap and cold or hot compresses as needed.  Disposition: Remain Inpatient  Code Status: Full code  Maretta Bees,   MD. 12/26/2010, 10:20 AM

## 2010-12-26 NOTE — Progress Notes (Signed)
INFECTIOUS DISEASES PROGRESS NOTE Subjective:    Patient ID: James Webb, male    DOB: 28-Oct-1990, 20 y.o.   MRN: [id number] 20yo male with left mandibular fracture and left neck cellulitis/early abscess who was initially placed on amb/sub but low doses and had persistent swelling and  Dental Pain    24hr events: continues to do better, his jaw pain is moderate at 4-5 of 10 on pain scale. No fevers  Abtx: imi #3 clinda #3 vanco #3  Review of Systems  constipated. No bm since 6 days ago.     Objective:   Physical Exam   BP 126/84  Pulse 56  Temp(Src) 97 F (36.1 C) (Oral)  Resp 20  Ht 6' (1.829 m)  Wt 161 lb 9.6 oz (73.3 kg)  BMI 21.92 kg/m2  SpO2 99%   General Appearance:    Alert, cooperative, no distress, appears stated age  Head:    Normocephalic, without obvious abnormality, atraumatic  Eyes:    PERRL, conjunctiva/corneas clear, EOM's intact, fundi    benign, both eyes          Nose:   Nares normal, septum midline, mucosa normal, no drainage   or sinus tenderness  Throat:   Lips, mucosa, and tongue normal; teeth and gums normal; phonation is normal  Neck:   Supple, swelling below mandible. Bandage from ix d, no active drainage     Lungs:     Clear to auscultation bilaterally, respirations unlabored  Chest wall:    tattoos  Heart:    Regular rate and rhythm, S1 and S2 normal, no murmur, rub   or gallop  Abdomen:     Soft, non-tender, bowel sounds active all four quadrants,    no masses, no organomegaly        Extremities:   Extremities normal, atraumatic, no cyanosis or edema  Pulses:   2+ and symmetric all extremities  Skin:   Skin color, texture, turgor normal, no rashes or lesions  Lymph nodes:   Cervical, supraclavicular, and axillary nodes normal  Neurologic:   CNII-XII intact. Normal strength, sensation and reflexes      throughout   Labs: CBC    Component Value Date/Time   WBC 6.5 12/24/2010 0500   RBC 3.89* 12/24/2010 0500   HGB 11.1*  12/24/2010 0500   HCT 32.8* 12/24/2010 0500   PLT 236 12/24/2010 0500   MCV 84.3 12/24/2010 0500   MCH 28.5 12/24/2010 0500   MCHC 33.8 12/24/2010 0500   RDW 13.2 12/24/2010 0500   LYMPHSABS 2.5 12/23/2010 0630   MONOABS 0.9 12/23/2010 0630   EOSABS 0.1 12/23/2010 0630   BASOSABS 0.0 12/23/2010 0630    CMP     Component Value Date/Time   NA 138 12/24/2010 0500   K 3.6 12/24/2010 0500   CL 103 12/24/2010 0500   CO2 29 12/24/2010 0500   GLUCOSE 88 12/24/2010 0500   BUN 3* 12/24/2010 0500   CREATININE 0.82 12/24/2010 0500   CALCIUM 9.0 12/24/2010 0500   PROT 7.2 12/21/2010 2018   ALBUMIN 3.2* 12/21/2010 2018   AST 18 12/21/2010 2018   ALT 12 12/21/2010 2018   ALKPHOS 49 12/21/2010 2018   BILITOT 0.7 12/21/2010 2018   GFRNONAA >90 12/24/2010 0500   GFRAA >90 12/24/2010 0500   Micro: Results for orders placed during the hospital encounter of 12/21/10  CULTURE, BLOOD (ROUTINE X 2)     Status: Normal (Preliminary result)   Collection Time   12/21/10 12:25 PM  Component Value Range Status Comment   Specimen Description BLOOD ARM RIGHT   Final    Special Requests BOTTLES DRAWN AEROBIC ONLY 5CC   Final    Setup Time [id number]   Final    Culture     Final    Value:        BLOOD CULTURE RECEIVED NO GROWTH TO DATE CULTURE WILL BE HELD FOR 5 DAYS BEFORE ISSUING A FINAL NEGATIVE REPORT   Report Status PENDING   Incomplete        Assessment & Plan:  20yo Male with cellulitis and small early abscess s/p aspiration on clindamycin and imipenem improved WBC and symptoms;  has decreased pain,  - would discontinue clindamycin, vanco, and imipenem, and switch back to unasyn 3gm Q6hr dosing  since he is improving. Upon discharge would consider switching to oral regimen of amox/clav and treat for total of 14 days.    James Beecham Jarrid Lienhard MD Infectious diseases [phone]

## 2010-12-26 NOTE — Progress Notes (Signed)
Subjective: Pt reports significant improvement in his pain. Tolerated liquid diet. No breathing difficulty.  Objective: Vital signs in last 24 hours: Temp:  [96.4 F (35.8 C)-97 F (36.1 C)] 97 F (36.1 C) (12/25 0545) Pulse Rate:  [48-56] 56  (12/25 0545) Resp:  [20] 20  (12/25 0545) BP: (126-131)/(79-84) 126/84 mmHg (12/25 0545) SpO2:  [99 %] 99 % (12/25 0545)  Physical Exam  Constitutional: He is oriented to person, place, and time. He appears well-developed and well-nourished. No distress.  HENT:  Head: Normocephalic and atraumatic.  Nose: Nose normal.  Mouth/Throat:  Submandibular swelling has decreased. Left angle of the mandible is mildly tender to touch. He has no stridor or difficulty managing his secretions at this time.Patient does have mild trismus.  Eyes: Conjunctivae and EOM are normal. Pupils are equal, round, and reactive to light.  Neck: Normal range of motion.  Musculoskeletal: Normal range of motion.  Neurological: He is alert and oriented to person, place, and time. No cranial nerve deficit. He exhibits normal muscle tone. Coordination normal.  Skin: Skin is warm and dry. No rash noted.  Psychiatric: He has a normal mood and affect.   Basename 12/24/10 0500  WBC 6.5  HGB 11.1*  HCT 32.8*  PLT 236    Basename 12/24/10 0500  NA 138  K 3.6  CL 103  CO2 29  GLUCOSE 88  BUN 3*  CREATININE 0.82  CALCIUM 9.0    Medications:  I have reviewed the patient's current medications. Scheduled:   . ampicillin-sulbactam (UNASYN) IV  3 g Intravenous Q6H  . zolpidem  5 mg Oral QHS  . DISCONTD: clindamycin (CLEOCIN) IV  900 mg Intravenous Q8H  . DISCONTD: imipenem-cilastatin  500 mg Intravenous Q6H  . DISCONTD: vancomycin  1,250 mg Intravenous Q8H  . DISCONTD: vancomycin  1,500 mg Intravenous Q12H    Assessment/Plan: Left neck cellulitis/abscess secondary to an acute nondisplaced left mandibular fracture.   The patient has clinically improved with primaxin  and clindamycin. Pt does have mild mandibular deviation to the right secondary to his left mandibular fracture.  If his mandibular deviation persists, he will need mandibular-maxillary fixation after the infection is under control.   May try mechanically soft diet. Will follow.    LOS: 5 days   Khylin Gutridge,SUI W 12/26/2010, 10:12 AM

## 2010-12-27 DIAGNOSIS — K011 Impacted teeth: Secondary | ICD-10-CM | POA: Diagnosis present

## 2010-12-27 NOTE — Progress Notes (Signed)
Subjective: Pt reports decreasing neck pain. Tolerated soft po. He had slight headache yesterday.  No visual change or new-onset numbness or weakness. No breathing difficulty.  Objective: Vital signs in last 24 hours: Temp:  [97.4 F (36.3 C)-98.6 F (37 C)] 97.4 F (36.3 C) (12/26 0550) Pulse Rate:  [44-63] 44  (12/26 0550) Resp:  [20] 20  (12/26 0550) BP: (116-121)/(70-73) 121/70 mmHg (12/26 0550) SpO2:  [100 %] 100 % (12/26 0550)  Physical Exam  Constitutional: He is oriented to person, place, and time. He appears well-developed and well-nourished. No distress.  HENT:  Head: Normocephalic and atraumatic.  Nose: Nose normal.  Mouth/Throat:  Submandibular swelling has decreased. Left angle of the mandible is mildly tender to touch. He has no stridor or difficulty managing his secretions at this time.Patient does have mild trismus.  Eyes: Conjunctivae and EOM are normal. Pupils are equal, round, and reactive to light.  Neck: Normal range of motion.  Musculoskeletal: Normal range of motion.  Neurological: He is alert and oriented to person, place, and time. No cranial nerve deficit. He exhibits normal muscle tone. Coordination normal.  Skin: Skin is warm and dry. No rash noted.  Psychiatric: He has a normal mood and affect.    Basename 12/26/10 1539  WBC 7.7  HGB 12.2*  HCT 35.2*  PLT 323    Medications:  I have reviewed the patient's current medications. Scheduled:   . ampicillin-sulbactam (UNASYN) IV  3 g Intravenous Q6H  . zolpidem  5 mg Oral QHS  . DISCONTD: clindamycin (CLEOCIN) IV  900 mg Intravenous Q8H  . DISCONTD: imipenem-cilastatin  500 mg Intravenous Q6H  . DISCONTD: vancomycin  1,250 mg Intravenous Q8H    Assessment/Plan: Left neck cellulitis/abscess secondary to an acute nondisplaced left mandibular fracture.   The patient has clinically improved with IV abx. Pt does have mild mandibular deviation to the right secondary to his left mandibular fracture.  If his mandibular deviation persists, he will need mandibular-maxillary fixation after the infection is under control.   Agree with transition to po abx in anticipation of discharge.  Pt may f/u with me as an outpatient 1 week after discharge.    LOS: 6 days   Fe Okubo,SUI W 12/27/2010, 8:23 AM

## 2010-12-27 NOTE — Progress Notes (Signed)
PATIENT DETAILS Name: James Webb Age: 20 y.o. Sex: male Date of Birth: 01-28-1990 Admit Date: 12/21/2010 PCP:No primary provider on file.  Interval History: James Webb  is a 20 year old male who has no significant past medical history, who prior to admission fall and hit his face on the left side, subsequently started having left neck, left submandibular area and swelling. He was also afebrile. On admission a CT scan of the neck region showed severe cellulitis of the left neck and face, he also had a phlegmon on the CT scan but there was no drainable abscess. Patient was admitted to the hospital and started on vancomycin and Unasyn. His fever slowly started to abate, however clinically he was almost unchanged, a CT scan of the soft tissue of the neck was again repeated on 12/21/2010, which showed worsening of his cellulitis. Infectious disease was consulted, antibiotics were then changed over to vancomycin clindamycin and Primaxin, ENT then performed a bedside aspiration yesterday. Clinically he is now improving. Current plans are to transition back to the Unasyn, watch him for the next 24-48 hours and then discharge him on Augmentin.  Subjective: Pain and swelling improved, tolerated softt diet   Objective: Vital signs in last 24 hours: Filed Vitals:   12/26/10 1300 12/26/10 1400 12/26/10 2118 12/27/10 0550  BP:  120/73 116/71 121/70  Pulse:  63 59 44  Temp:  98.6 F (37 C) 97.8 F (36.6 C) 97.4 F (36.3 C)  TempSrc: Oral Oral Oral Oral  Resp:  20 20 20   Height:      Weight:      SpO2:  100% 100% 100%    Weight change:   Body mass index is 21.92 kg/(m^2).  Intake/Output from previous day:  Intake/Output Summary (Last 24 hours) at 12/27/10 0937 Last data filed at 12/27/10 0841  Gross per 24 hour  Intake    480 ml  Output   2200 ml  Net  -1720 ml    PHYSICAL EXAM: Gen Exam: Awake and alert with clear speech.  Difficulty opening mouth. Neck: Supple, No JVD.  No  stridor heared HEENT-painfull swelling in the sub-mental area and left submandibular area.Tender to touch as well. Chest: B/L Clear.   CVS: S1 S2 Regular, no murmurs.  Abdomen: soft, BS +, non tender, non distended.  Extremities: no edema, lower extremities warm to touch. small swelling just below the  right patella area-moderately tender to palpation. No overlying erythema.  Neurologic: Non Focal.   Skin: No Rash.   Wounds: N/A.    CONSULTS:  ENT ID- pending  LAB RESULTS: CBC  Lab 12/26/10 1539 12/24/10 0500 12/23/10 0630 12/22/10 0630 12/21/10 1241 12/21/10 1215  WBC 7.7 6.5 10.3 13.5* -- 10.9*  HGB 12.2* 11.1* 11.2* 12.2* 14.6 --  HCT 35.2* 32.8* 32.7* 35.3* 43.0 --  PLT 323 236 243 258 -- 268  MCV 83.2 84.3 84.3 84.0 -- 84.9  MCH 28.8 28.5 28.9 29.0 -- 28.7  MCHC 34.7 33.8 34.3 34.6 -- 33.8  RDW 13.1 13.2 13.4 13.5 -- 13.7  LYMPHSABS -- -- 2.5 -- -- 1.8  MONOABS -- -- 0.9 -- -- 0.9  EOSABS -- -- 0.1 -- -- 0.1  BASOSABS -- -- 0.0 -- -- 0.0  BANDABS -- -- -- -- -- --    Chemistries   Lab 12/24/10 0500 12/23/10 0630 12/22/10 0630 12/21/10 2018 12/21/10 1241  NA 138 137 133* -- 140  K 3.6 3.2* 3.3* -- 3.7  CL 103 100 100 -- 105  CO2 29 29 25  -- --  GLUCOSE 88 92 99 -- 75  BUN 3* <3* 3* -- 7  CREATININE 0.82 0.85 0.78 -- 1.10  CALCIUM 9.0 9.0 9.0 -- --  MG -- -- -- 1.9 --   RADIOLOGY STUDIES/RESULTS: Dg Orthopantogram  12/22/2010  *RADIOLOGY REPORT*  Clinical Data: Left jaw pain.  Larey Seat out of bed and hit face on table.  DG ORTHOPANTOGRAM/PANORAMIC  Comparison: CT neck 12/21/2010  Findings: Impacted left maxillary wisdom tooth (17) with an acute fracture extending from the roots to the angle of the mandible. There is a lucency surrounding the tooth suggesting significant periapical disease. Periapical disease surrounding this tooth was present on prior CT.  Multiple missing teeth on the right  mandible, suspected absent 29, 30, and 32.  Impacted right maxillary wisdom  tooth (1).  Significant dental caries tooth 15.  IMPRESSION: Impacted  left maxillary wisdom tooth (17) with significant periodontal disease, now with superimposed acute unilateral left mandibular fracture coursing obliquely from the roots towards the angle of the mandible.  See comments above.  Original Report Authenticated By: Elsie Stain, M.D.   Ct Soft Tissue Neck W Contrast  12/21/2010  *RADIOLOGY REPORT*  Clinical Data: 20 year old male with left-sided neck pain radiating to the jaw.  Suspect deep space neck infection.  CT NECK WITH CONTRAST  Technique:  Multidetector CT imaging of the neck was performed with intravenous contrast.  Contrast: 75mL OMNIPAQUE IOHEXOL 300 MG/ML IV SOLN  Comparison: None.  Findings: Lung apices are clear.  Negative visualized superior mediastinum.  Paucity of subcutaneous fat.  Hyperenhancing level I lymph nodes are mildly enlarged up to 9 mm short axis.  There is widespread indistinct fat stranding along the left and anterior neck, including the submental space.  Hyperenhancing level II nodes measure up to 17 mm in short axis.   Stranding surrounds both submandibular glands, greater on the left, but no definite sialoadenitis is noted.  The left platysma is asymmetrically thickened.  The parotid glands appear within normal limits.  The parapharyngeal and sublingual spaces are within normal limits. There may be a trace retropharyngeal effusion, but there is no drainable or organized fluid collection in the neck.  The adenoid and tonsillar pillars are hypertrophied.  The larynx and thyroid are normal.  Visualized major vascular structures are patent including both internal jugular veins.  Negative visualized brain parenchyma and orbits. Visualized paranasal sinuses and mastoids are clear.  Large dental caries of the posterior left maxillary molar just anterior to the wisdom tooth.  Still, this does not seem to be the epicenter of the previously described inflammation.  No  other acute osseous abnormality.  IMPRESSION: 1.  Severe cellulitis of the left face and neck.  Significant submental involvement with phlegmon, but no drainable or organized fluid collection or sublingual space extension. If the patient does not improve on the appropriate treatment as expected, recommend repeat imaging with attention to this area. 2.  Reactive lymphadenopathy; left level II and bilateral level I. 3.  Large dental caries left maxillary posterior molar for which dental follow up is recommended, but this does not seem to be the epicenter of the inflammatory process.  Original Report Authenticated By: Harley Hallmark, M.D.    MEDICATIONS: Scheduled Meds:    . ampicillin-sulbactam (UNASYN) IV  3 g Intravenous Q6H  . zolpidem  5 mg Oral QHS  . DISCONTD: clindamycin (CLEOCIN) IV  900 mg Intravenous Q8H  . DISCONTD: imipenem-cilastatin  500 mg Intravenous  Q6H  . DISCONTD: vancomycin  1,250 mg Intravenous Q8H   Continuous Infusions:    . sodium chloride 50 mL/hr at 12/26/10 0545   PRN Meds:.acetaminophen, morphine, ondansetron (ZOFRAN) IV, ondansetron, oxyCODONE  Antibiotics: Anti-infectives     Start     Dose/Rate Route Frequency Ordered Stop   12/26/10 1100  Ampicillin-Sulbactam (UNASYN) 3 g in sodium chloride 0.9 % 100 mL IVPB       3 g 100 mL/hr over 60 Minutes Intravenous Every 6 hours 12/26/10 1012     12/25/10 1600   vancomycin (VANCOCIN) 1,250 mg in sodium chloride 0.9 % 250 mL IVPB  Status:  Discontinued        1,250 mg 166.7 mL/hr over 90 Minutes Intravenous Every 8 hours 12/25/10 1348 12/26/10 1011   12/24/10 2200   clindamycin (CLEOCIN) IVPB 900 mg  Status:  Discontinued        900 mg 100 mL/hr over 30 Minutes Intravenous 3 times per day 12/24/10 1257 12/26/10 1011   12/24/10 1500   imipenem-cilastatin (PRIMAXIN) 500 mg in sodium chloride 0.9 % 100 mL IVPB  Status:  Discontinued        500 mg 200 mL/hr over 30 Minutes Intravenous Every 6 hours 12/24/10 0856  12/26/10 1011   12/24/10 0900   imipenem-cilastatin (PRIMAXIN) 500 mg in sodium chloride 0.9 % 100 mL IVPB        500 mg 200 mL/hr over 30 Minutes Intravenous NOW 12/24/10 0851 12/24/10 1100   12/24/10 0800   clindamycin (CLEOCIN) IVPB 300 mg  Status:  Discontinued        300 mg 100 mL/hr over 30 Minutes Intravenous 4 times per day 12/24/10 0745 12/24/10 1257   12/22/10 1600   ampicillin-sulbactam (UNASYN) 1.5 g in sodium chloride 0.9 % 50 mL IVPB  Status:  Discontinued        1.5 g 100 mL/hr over 30 Minutes Intravenous Every 6 hours 12/22/10 0856 12/24/10 0745   12/22/10 0900   ampicillin-sulbactam (UNASYN) 1.5 g in sodium chloride 0.9 % 50 mL IVPB        1.5 g 100 mL/hr over 30 Minutes Intravenous NOW 12/22/10 0856 12/22/10 1224   12/22/10 0800   vancomycin (VANCOCIN) 1,500 mg in sodium chloride 0.9 % 500 mL IVPB  Status:  Discontinued        1,500 mg 250 mL/hr over 120 Minutes Intravenous Every 12 hours 12/22/10 0523 12/25/10 1346   12/22/10 0530   vancomycin (VANCOCIN) 500 mg in sodium chloride 0.9 % 100 mL IVPB        500 mg 100 mL/hr over 60 Minutes Intravenous  Once 12/22/10 0523 12/22/10 0720   12/21/10 2015   vancomycin (VANCOCIN) IVPB 1000 mg/200 mL premix  Status:  Discontinued        1,000 mg 200 mL/hr over 60 Minutes Intravenous Every 12 hours 12/21/10 2010 12/21/10 2028   12/21/10 1445   vancomycin (VANCOCIN) IVPB 1000 mg/200 mL premix        1,000 mg 200 mL/hr over 60 Minutes Intravenous  Once 12/21/10 1437 12/21/10 1607          Assessment/Plan: Patient Active Hospital Problem List: Cellulitis, Submental and Left Submandibular area   Assessment: Significantly better    Plan: We'll transition back to Unasyn today and continue to monitor for 24-48 hours.  Fracture of the Left Mandible: Controlled pain was narcotics. Per ENT there is right mild mandibular deviation secondary to his left mandibular fracture. Probably patient  will need mandibular maxillary  fixation of this deviation persists. Likely after the infection under control. Patient to followup with Dr. Suszanne Conners as outpatient  Impacted wisdom tooth: Patient need followup with his dentist.  Small infrapatellar swelling Assessment:? Small hematoma from falling. This is completely resolved now. Plan: Monitor, Ace wrap and cold or hot compresses as needed.  Disposition: Remain Inpatient, D/C in AM  Code Status: Full code Elif Yonts A 12/27/2010, 9:37 AM

## 2010-12-27 NOTE — Progress Notes (Signed)
INFECTIOUS DISEASES PROGRESS NOTE Subjective:    Patient ID: James Webb, male    DOB: 02-Aug-1990, 20 y.o.   MRN: [id number] 20yo male with left mandibular fracture and left neck cellulitis/early abscess who was initially placed on amb/sub but low doses and had persistent swelling and  Dental Pain    24hr events: jaw pain continues to improve. Had soft foods yesterday without too much discomfort. No fevers  Abtx: Amp/sub 3gm Q6hr  Review of Systems 12 point ROS otherwise negative.     Objective:   Physical Exam   BP 121/70  Pulse 44  Temp(Src) 97.4 F (36.3 C) (Oral)  Resp 20  Ht 6' (1.829 m)  Wt 161 lb 9.6 oz (73.3 kg)  BMI 21.92 kg/m2  SpO2 100%   General Appearance:    Alert, cooperative, no distress, appears stated age  Head:    Normocephalic, without obvious abnormality, atraumatic  Eyes:    PERRL, conjunctiva/corneas clear, EOM's intact, fundi    benign, both eyes          Nose:   Nares normal, septum midline, mucosa normal, no drainage   or sinus tenderness  Throat:   Lips, mucosa, and tongue normal; teeth and gums normal; phonation is normal  Neck:   Supple, swelling below mandible. Bandage from ix d, no active drainage     Lungs:     Clear to auscultation bilaterally, respirations unlabored  Chest wall:    tattoos  Heart:    Regular rate and rhythm, S1 and S2 normal, no murmur, rub   or gallop  Abdomen:     Soft, non-tender, bowel sounds active all four quadrants,    no masses, no organomegaly        Extremities:   Extremities normal, atraumatic, no cyanosis or edema  Pulses:   2+ and symmetric all extremities  Skin:   Skin color, texture, turgor normal, no rashes or lesions  Lymph nodes:   Cervical, supraclavicular, and axillary nodes normal  Neurologic:   CNII-XII intact. Normal strength, sensation and reflexes      throughout   Labs: CBC    Component Value Date/Time   WBC 7.7 12/26/2010 1539   RBC 4.23 12/26/2010 1539   HGB 12.2*  12/26/2010 1539   HCT 35.2* 12/26/2010 1539   PLT 323 12/26/2010 1539   MCV 83.2 12/26/2010 1539   MCH 28.8 12/26/2010 1539   MCHC 34.7 12/26/2010 1539   RDW 13.1 12/26/2010 1539   LYMPHSABS 2.5 12/23/2010 0630   MONOABS 0.9 12/23/2010 0630   EOSABS 0.1 12/23/2010 0630   BASOSABS 0.0 12/23/2010 0630   Micro: Results for orders placed during the hospital encounter of 12/21/10  CULTURE, BLOOD (ROUTINE X 2)     Status: Normal (Preliminary result)   Collection Time   12/21/10 12:25 PM      Component Value Range Status Comment   Specimen Description BLOOD ARM RIGHT   Final    Special Requests BOTTLES DRAWN AEROBIC ONLY 5CC   Final    Setup Time [id number]   Final    Culture     Final    Value:        BLOOD CULTURE RECEIVED NO GROWTH TO DATE CULTURE WILL BE HELD FOR 5 DAYS BEFORE ISSUING A FINAL NEGATIVE REPORT   Report Status PENDING   Incomplete        Assessment & Plan:  20yo Male with cellulitis and small early abscess s/p aspiration on clindamycin and imipenem improved  WBC and symptoms;  has decreased pain, -> switched to amp/sub on 12/25  - would continue on unasyn 3gm Q6hr dosing for now. Upon discharge,  Would switch to oral regimen of amox/clav and treat for total of 14 days ( including the days he received IV antibiotics- starting with imipenem).   - we will sign off, please call if questions. Dr. Darlina Sicilian to be available from 12/27-12/30   Aram Beecham Zanayah Shadowens MD Infectious diseases [phone]

## 2010-12-28 LAB — CULTURE, BLOOD (ROUTINE X 2): Culture: NO GROWTH

## 2010-12-28 MED ORDER — OXYCODONE-ACETAMINOPHEN 5-325 MG PO TABS: 1.0000 | ORAL_TABLET | Freq: Four times a day (QID) | ORAL | Status: AC | PRN

## 2010-12-28 MED ORDER — AMOXICILLIN-POT CLAVULANATE 500-125 MG PO TABS: 1.0000 | ORAL_TABLET | Freq: Two times a day (BID) | ORAL | Status: AC

## 2010-12-28 NOTE — Progress Notes (Signed)
   CARE MANAGEMENT NOTE 12/28/2010  Patient:  Bolinger,Chares   Account Number:  [id number]  Date Initiated:  12/22/2010  Documentation initiated by:  Letha Cape  Subjective/Objective Assessment:   dx cellulitis of face  admit- lives with family     Action/Plan:   ENT to see   Anticipated DC Date:  12/25/2010   Anticipated DC Plan:  HOME/SELF CARE      DC Planning Services  CM consult  Indigent Health Clinic  Medication Assistance  Other      Choice offered to / List presented to:             Status of service:  Completed, signed off Medicare Important Message given?   (If response is "NO", the following Medicare IM given date fields will be blank) Date Medicare IM given:   Date Additional Medicare IM given:    Discharge Disposition:  HOME/SELF CARE  Per UR Regulation:  Reviewed for med. necessity/level of care/duration of stay  Comments:  PCP [facility] eligibility appt 1/23 at 3 pm, hos f/u apt 2/20 at 2:30 with Dr. Andrey Campanile  12/28/10- 1200- Donn Pierini RN, BSN [phone] Pt for discharge home today, assisted with oral abx througth indigent fund.  12/27/10- 1630- Donn Pierini RN, BSN [phone] faxed H&P to [facility], pt will also need Dentist to follow up with. Will give info to [facility] dental clinic-for possible d/c in am.  12/22/10 15:04 Letha Cape RN, BSN [phone] patient lives with family, pta independent, NCM will continue to follow for dc needs. Patient is eiligible for med ast if needed.

## 2010-12-28 NOTE — Discharge Summary (Signed)
HOSPITAL DISCHARGE SUMMARY  James Webb  MRN: [id number]  DOB:1990-07-28  Date of Admission: 12/21/2010 Date of Discharge: 12/28/2010         LOS: 7 days   Attending Physician:Demarea Lorey A  Patient's PCP: Patient set up with [facility] Consults: Treatment Team:  Darletta Moll   Discharge Diagnoses: Present on Admission:  .Cellulitis, face .Fracture of mandible, jaw angle .Impacted tooth   Current Discharge Medication List    START taking these medications   Details  amoxicillin-clavulanate (AUGMENTIN) 500-125 MG per tablet Take 1 tablet (500 mg total) by mouth 2 (two) times daily. Qty: 14 tablet, Refills: 0    oxyCODONE-acetaminophen (ROXICET) 5-325 MG per tablet Take 1 tablet by mouth every 6 (six) hours as needed for pain. Qty: 30 tablet, Refills: 0      STOP taking these medications     ibuprofen (ADVIL,MOTRIN) 200 MG tablet          Brief Admission History: Patient is a 20 year old male with no history of prior medical problems who presents to [facility] ED with main concern of left-sided neck swelling, difficulty swallowing, sensation that his throat is closing, and left molar pain. Patient has history of fillings in his posterior-most 3 teeth on the left mandible. He has not seen a dentist. Pt also noticed swelling under his left chin yesterday, one day prior to admission. Pt denies any specific aggravating or alleviating factors, no similar episodes in the past, no known sick contacts or exposures, no fevers, no chills, no other systemic symptoms of weight loss or gain, changes in appetite, no night sweats.  Hospital Course: Present on Admission:  .Cellulitis, face .Fracture of mandible, jaw angle .Impacted tooth  1. Facial cellulitis: Patient admitted to the hospital with a left-sided facial cellulitis with cervical lymphadenopathy. This is probably secondary to left jaw angle mandibular fracture. After patient admitted Dr. Suszanne Conners from ENT was consulted as  well as infectious disease service. CT scan was repeated to rule out abscess formation and it did show only phlegmon without abscess. Patient was on imipenem at the time of admission that was switched to Unasyn. And infectious disease service recommended to switch antibiotic to oral Augmentin at the time of discharge to complete 14 days of antibiotics.  2. Fracture of the mandible, jaw angle: Patient said he had recent fall which she felt to nearby nightstand and after that he did develop the facial swelling and the erythema. The orthopantogram showed left mandible fracture with impacted tooth. Dr. Suszanne Conners was following for the mandibular fracture and he noted minimal mandibular deviation. And if that deviation continues patient might need mandibular fracture fixation. Patient will followup as outpatient with Dr. Suszanne Conners.  3. Impacted wisdom tooth: Left maxillary tooth #17 impacted. Patient needs followup as outpatient with a dentist. [facility]. He will need to referral to dentist for his impacted tooth.   Day of Discharge BP 122/76  Pulse 48  Temp(Src) 97.2 F (36.2 C) (Oral)  Resp 18  Ht 6' (1.829 m)  Wt 73.3 kg (161 lb 9.6 oz)  BMI 21.92 kg/m2  SpO2 100% Physical Exam: GEN: No acute distress, cooperative with exam PSYCH: He is alert and oriented x4; does not appear anxious does not appear depressed; affect is normal  HEENT: Mucous membranes pink and anicteric;  Mouth: without oral thrush or lesions Eyes: PERRLA; EOM intact;  Neck: no cervical lymphadenopathy nor thyromegaly or carotid bruit; no JVD;  CHEST WALL: No tenderness, symmetrical  to breathing bilaterally CHEST: Normal respiration, clear to auscultation bilaterally  HEART: Regular rate and rhythm; no murmurs, rubs or gallops, S1 and S2 heard  BACK: No kyphosis or scoliosis; no CVA tenderness  ABDOMEN:  soft non-tender; no masses, no organomegaly, normal abdominal bowel sounds; no  pannus; no intertriginous candida.  EXTREMITIES: No bone or joint deformity; no edema; no ulcerations.  PULSES: 2+ and symmetric, neurovascularity is intact SKIN: Normal hydration no rash or ulceration, no flushing or suspicious lesions  CNS: Cranial nerves 2-12 grossly intact no focal neurologic deficit, coordination is intact gait not tested    Results for orders placed during the hospital encounter of 12/21/10 (from the past 24 hour(s))  GLUCOSE, CAPILLARY     Status: Normal   Collection Time   12/28/10  8:28 AM      Component Value Range   Glucose-Capillary 78  70 - 99 (mg/dL)   Comment 1 Notify RN      Disposition: Home   Follow-up Appts: Discharge Orders    Future Orders Please Complete By Expires   Diet general      Scheduling Instructions:   Soft Diet.   Increase activity slowly         Follow-up Information    Follow up with [facility] on 01/24/2011. (for eligibility at 3 pm)    Contact information:   [phone]      Follow up with [facility] on 02/21/2011. (for hos f/u at 2:30 with Dr. Andrey Campanile)    Contact information:   [phone]      Follow up with [facility]. Make an appointment in 1 week.   Contact information:   [address] [place] [address] [phone]          I spent 40 minutes completing paperwork and coordinating discharge efforts.  SignedClydia Llano A 12/28/2010, 12:05 PM

## 2011-01-16 NOTE — Op Note (Signed)
Op Note (Surgery done at bedside under local anesthesia)  PRE-OPERATIVE DIAGNOSIS: 1) Left neck cellulitis/abscess 2) Left mandibular fracture   POST-OPERATIVE DIAGNOSIS: 1) Left neck cellulitis/abscess 2) Left mandibular fracture   PROCEDURE: Aspiration and drainage of left neck abscess   SURGEON: Alyxandra Tenbrink Darrold Span Ledarrius Beauchaine   ANESTHESIA: local   EBL: minimal   LOCAL MEDICATIONS USED: LIDOCAINE 2CC   Anesthesia: local anesthesia with 1% lidocaine with epinephrine.  Description: The patient is placed upright on the hospital bed. After adequate local anesthesia is achieved, an 18-G needle is used to make multiple passes over the left submandibular area and around the angle of mandible. Approximately 0.2 cc of purulent fluid was evacuated. The patient tolerated the procedure well.   SPECIMEN: No Specimen   DISPOSITION OF SPECIMEN: N/A   PLAN OF CARE: Remain an inpatient

## 2014-03-30 ENCOUNTER — Emergency Department (HOSPITAL_COMMUNITY)
Admission: EM | Admit: 2014-03-30 | Discharge: 2014-03-30 | Disposition: A | Discharge status: Home / Self Care | Payer: Self-pay | Attending: Emergency Medicine | Admitting: Emergency Medicine

## 2014-03-30 ENCOUNTER — Emergency Department (HOSPITAL_COMMUNITY): Payer: Self-pay

## 2014-03-30 ENCOUNTER — Encounter (HOSPITAL_COMMUNITY): Payer: Self-pay | Admitting: Emergency Medicine

## 2014-03-30 DIAGNOSIS — Z8669 Personal history of other diseases of the nervous system and sense organs: Secondary | ICD-10-CM | POA: Insufficient documentation

## 2014-03-30 DIAGNOSIS — L03119 Cellulitis of unspecified part of limb: Secondary | ICD-10-CM

## 2014-03-30 DIAGNOSIS — Z720 Tobacco use: Secondary | ICD-10-CM | POA: Insufficient documentation

## 2014-03-30 DIAGNOSIS — L03115 Cellulitis of right lower limb: Secondary | ICD-10-CM | POA: Insufficient documentation

## 2014-03-30 DIAGNOSIS — M25561 Pain in right knee: Secondary | ICD-10-CM

## 2014-03-30 MED ORDER — OXYCODONE-ACETAMINOPHEN 5-325 MG PO TABS: 1.0000 | ORAL_TABLET | Freq: Four times a day (QID) | ORAL | Status: AC | PRN

## 2014-03-30 MED ORDER — OXYCODONE-ACETAMINOPHEN 5-325 MG PO TABS
1.0000 | ORAL_TABLET | Freq: Once | ORAL | Status: AC
  Administered 2014-03-30: 1 via ORAL
  Filled 2014-03-30: qty 1

## 2014-03-30 MED ORDER — SULFAMETHOXAZOLE-TRIMETHOPRIM 800-160 MG PO TABS: 1.0000 | ORAL_TABLET | Freq: Two times a day (BID) | ORAL | Status: AC

## 2014-03-30 NOTE — ED Notes (Signed)
Pt here for evaluation of right knee pain x 1 day. Pt reports waking up yesterday with a knot on his right knee, tender on palpation. Denies injury, states he doesn't workout or exercise. Alert and oriented. VSS.

## 2014-03-30 NOTE — Discharge Instructions (Signed)
Return to the ED with any concerns including fever/chills, increased area of pain or redness, swelling of knee, vomiting and not able to keep down antibiotics, decreased level of alertness/lethargy, or any other alarming symptoms

## 2014-03-30 NOTE — ED Notes (Signed)
Pt now stating "my circulation in my leg is low, like my leg feels numb. I feel like I have to pee, then I don't have too." Strong pedal pulses present bilaterally, no sensory deficits noted. Pt able to feel pinprick to right leg.

## 2014-03-30 NOTE — ED Provider Notes (Signed)
CSN: [id number]     Arrival date & time 03/30/14  0716 History   First MD Initiated Contact with Patient 03/30/14 0719     Chief Complaint  Patient presents with  . Knee Pain     (Consider location/radiation/quality/duration/timing/severity/associated sxs/prior Treatment) HPI  Pt presenting with c/o pain on front and below right knee.  Pt states he noted the pain yesterday and a small area of swelling in the area.  No trauma or injury known.  Does not know of a break in the skin or insect bite in the area.  Area is painful with palpation.  No fever/chills. No vomiting or systemic symptoms.  He has not had any treatment for his symptoms prior to arrival.  There are no other associated systemic symptoms, there are no other alleviating or modifying factors.   Past Medical History  Diagnosis Date  . 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 Shortness of breath     SOB x 3 days   History reviewed. No pertinent past surgical history. No family history on file. History  Substance Use Topics  . Smoking status: Current Some Day Smoker -- 0.25 packs/day for 10 years    Types: Cigarettes  . Smokeless tobacco: Never Used  . Alcohol Use: 5.4 oz/week    4 Cans of beer, 5 Shots of liquor per week    Review of Systems  ROS reviewed and all otherwise negative except for mentioned in HPI    Allergies  Review of patient's allergies indicates no known allergies.  Home Medications   Prior to Admission medications   Medication Sig Start Date End Date Taking? Authorizing Provider  oxyCODONE-acetaminophen (PERCOCET/ROXICET) 5-325 MG per tablet Take 1-2 tablets by mouth every 6 (six) hours as needed for severe pain. 03/30/14   Jerelyn Scott, MD  sulfamethoxazole-trimethoprim (SEPTRA DS) 800-160 MG per tablet Take 1 tablet by mouth 2 (two) times daily. 03/30/14   Jerelyn Scott, MD   BP 114/78 mmHg  Pulse 57  Temp(Src) 98.5 F (36.9 C) (Oral)  Resp 16  Ht  (1.803 m)  Wt 164  lb (74.39 kg)  BMI 22.88 kg/m2  SpO2 100%  Vitals reviewed Physical Exam  Physical Examination: General appearance - alert, well appearing, and in no distress Mental status - alert, oriented to person, place, and time Eyes - no conjunctival injection, no scleral icterus Chest - normal respiratory efffort Neurological - alert, oriented x 3, normal speech Musculoskeletal - ttp over anterior inferior knee- inferior to right knee over superior tibial region there is an area approx 2cm of firmness, tenderness, warmth- no fluctuance of induration, no overlying erythema, no joint tenderness, deformity or swelling Extremities - peripheral pulses normal, no pedal edema, no clubbing or cyanosis Skin - normal coloration and turgor, no rashes  ED Course  Procedures (including critical care time) Labs Review Labs Reviewed - No data to display  Imaging Review No results found.   EKG Interpretation None      MDM   Final diagnoses:  Right knee pain  Cellulitis of knee   Pt presenting with c/o pain in right knee.  There is an area of swelling below proper knee joint- no fluctuance or induration to suggest abscess.  Likely cellulitis.  No joint effusion, no erythema overlying joint and no significant pain with ROM of knee.  I feel there is a cellulitis but do not believe it affects the knee joint proper.  Pt started on  antibiotics.  Discharged with strict return precautions.  Pt agreeable with plan.    Jerelyn ScottMartha Linker, MD 04/01/14 [phone]

## 2017-01-06 IMAGING — CR DG KNEE COMPLETE 4+V*R*
4 series · 4 of 4 positions shown · non-contrast
Comparison: None.

CLINICAL DATA: Right knee pain for 2 days, no known injury, initial
encounter

EXAM:
RIGHT KNEE - COMPLETE 4+ VIEW

[knee ap]
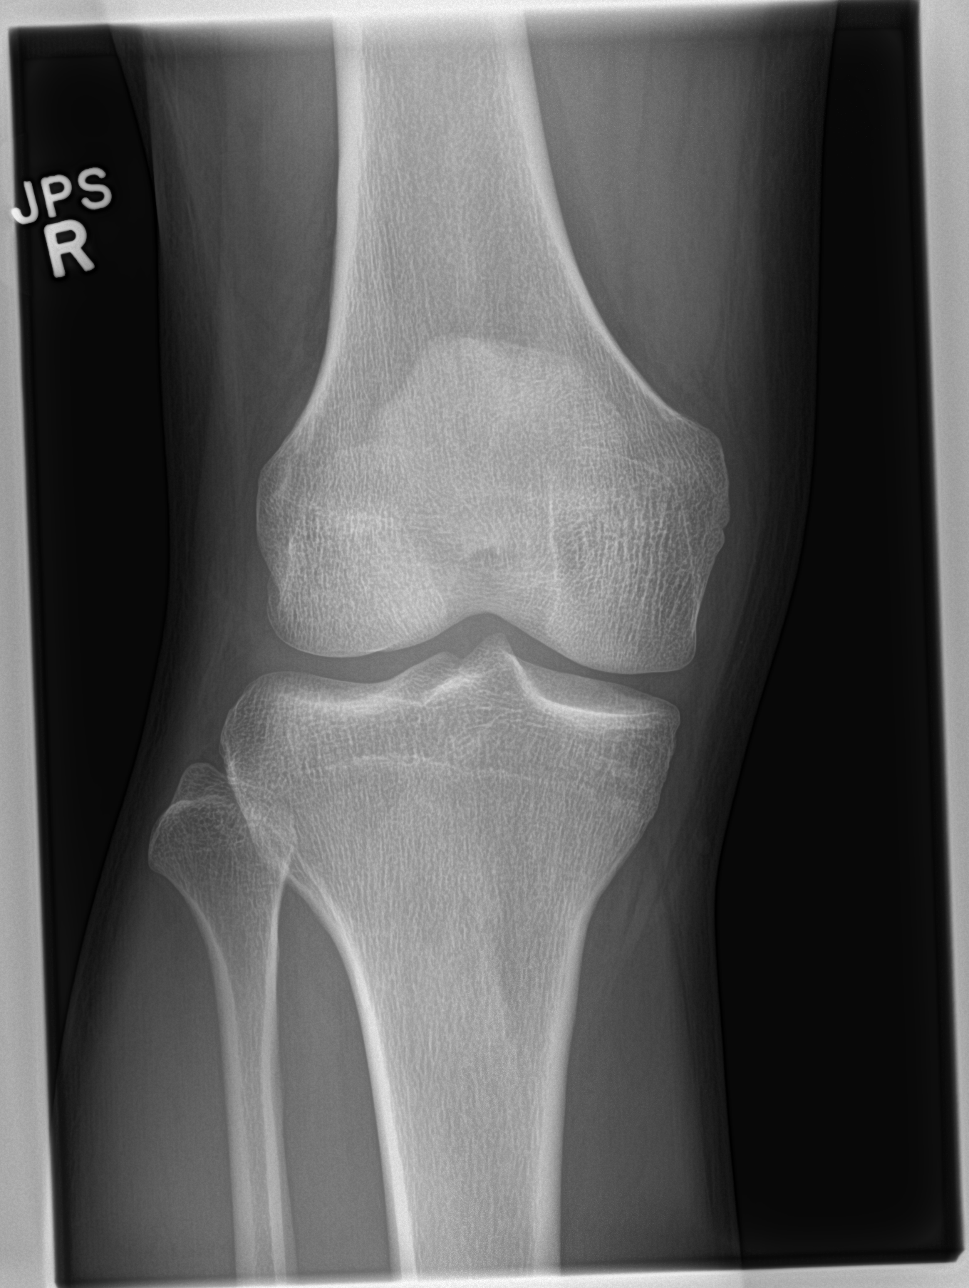

[knee obl (1 of 2)]
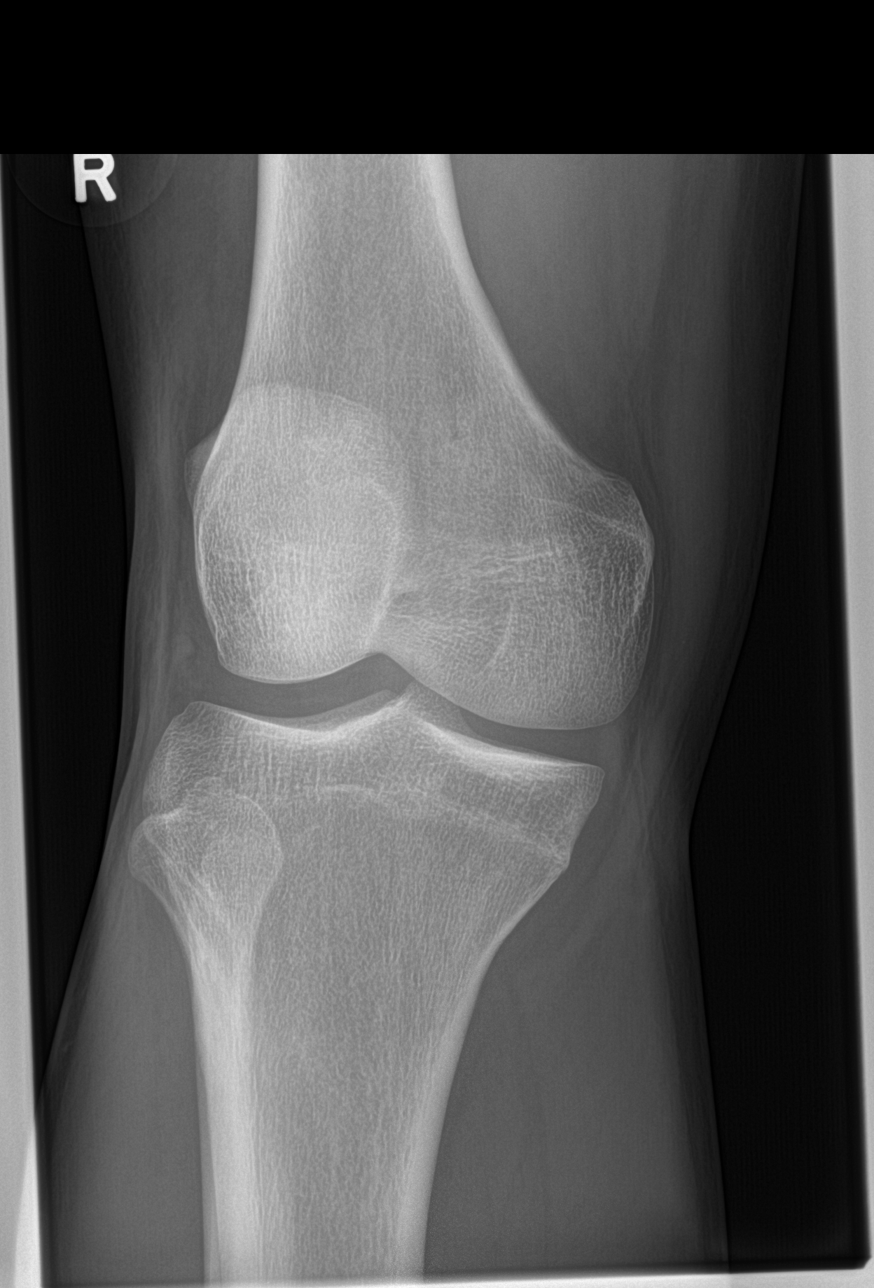

[knee obl (2 of 2)]
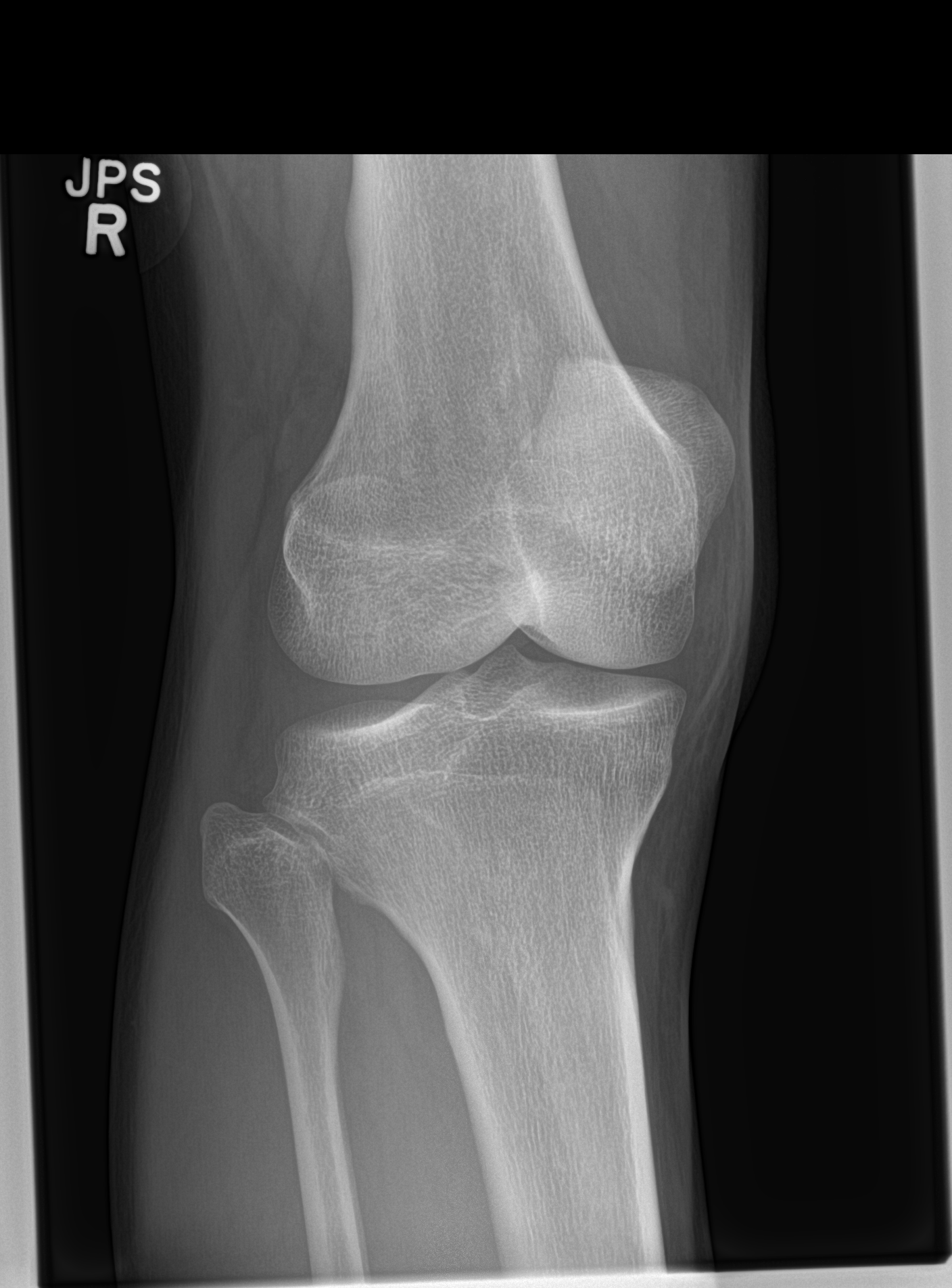

[knee lat]
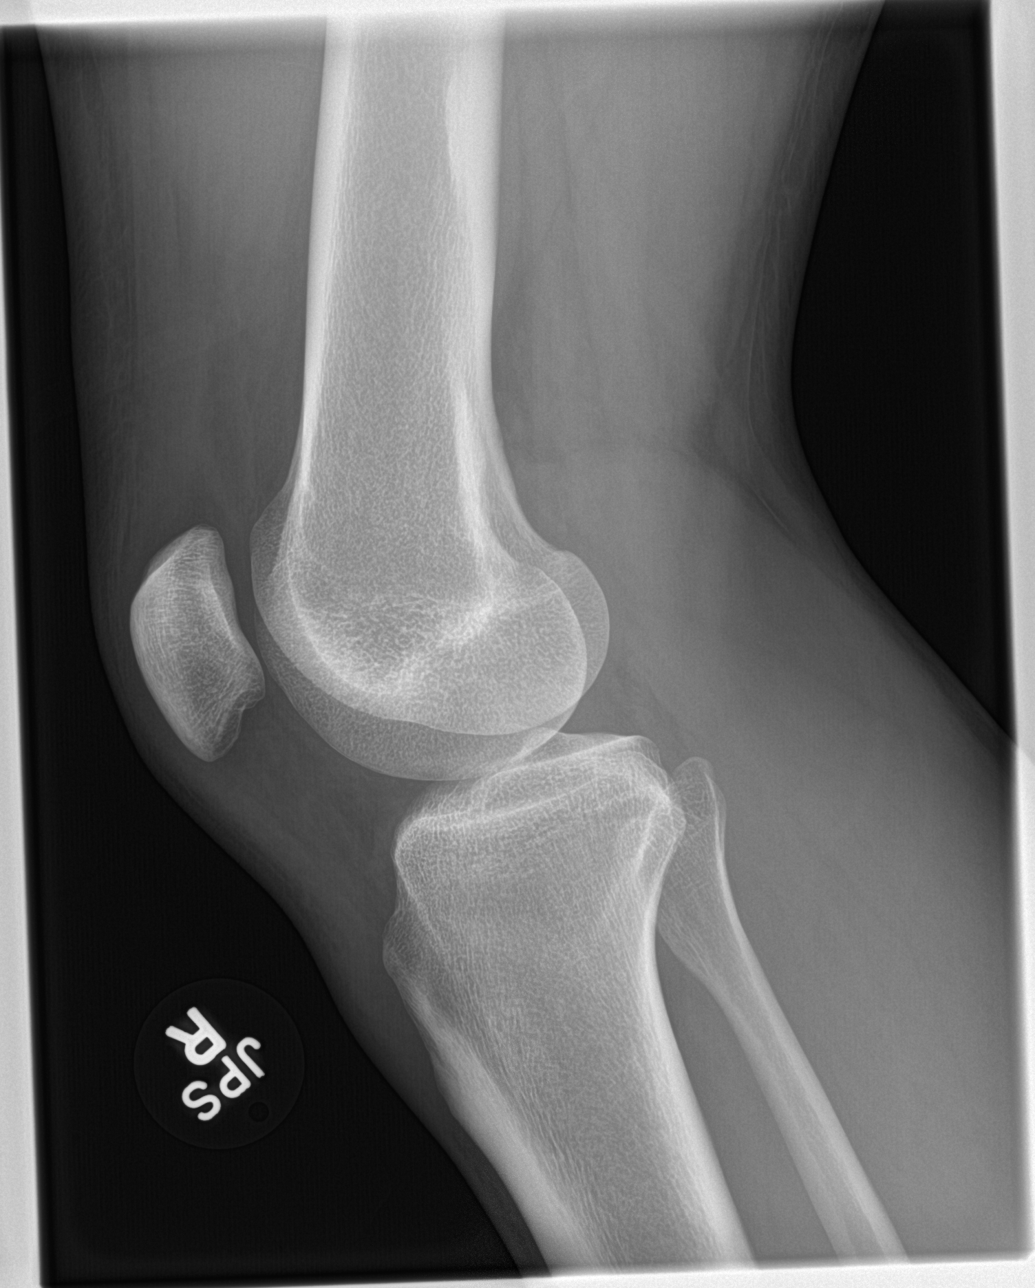

[4 of 4 positions shown; findings below may reference images not displayed]

FINDINGS: There is no evidence of fracture, dislocation, or joint effusion.
There is no evidence of arthropathy or other focal bone abnormality.
Soft tissues are unremarkable.
IMPRESSION: No acute abnormality noted.
